# Patient Record
Sex: Female | Born: 1980 | Race: Black or African American | Hispanic: No | Marital: Single | Smoking: Former smoker
Health system: Southern US, Community
[De-identification: ages and names within clinical notes are randomized; demographics above are authoritative.]

## PROBLEM LIST (undated history)

## (undated) DIAGNOSIS — J45909 Unspecified asthma, uncomplicated: Secondary | ICD-10-CM

## (undated) DIAGNOSIS — IMO0002 Reserved for concepts with insufficient information to code with codable children: Secondary | ICD-10-CM

## (undated) DIAGNOSIS — R87619 Unspecified abnormal cytological findings in specimens from cervix uteri: Secondary | ICD-10-CM

## (undated) HISTORY — DX: Reserved for concepts with insufficient information to code with codable children: IMO0002

## (undated) HISTORY — DX: Unspecified abnormal cytological findings in specimens from cervix uteri: R87.619

---

## 2012-04-20 ENCOUNTER — Emergency Department (HOSPITAL_COMMUNITY)
Admission: EM | Admit: 2012-04-20 | Discharge: 2012-04-20 | Disposition: A | Discharge status: Home / Self Care | Payer: Self-pay | Attending: Emergency Medicine | Admitting: Emergency Medicine

## 2012-04-20 ENCOUNTER — Encounter (HOSPITAL_COMMUNITY): Payer: Self-pay | Admitting: *Deleted

## 2012-04-20 ENCOUNTER — Emergency Department (HOSPITAL_COMMUNITY): Payer: Self-pay

## 2012-04-20 DIAGNOSIS — R10811 Right upper quadrant abdominal tenderness: Secondary | ICD-10-CM | POA: Insufficient documentation

## 2012-04-20 DIAGNOSIS — N390 Urinary tract infection, site not specified: Secondary | ICD-10-CM | POA: Insufficient documentation

## 2012-04-20 DIAGNOSIS — J45909 Unspecified asthma, uncomplicated: Secondary | ICD-10-CM | POA: Insufficient documentation

## 2012-04-20 DIAGNOSIS — R1110 Vomiting, unspecified: Secondary | ICD-10-CM | POA: Insufficient documentation

## 2012-04-20 DIAGNOSIS — F17200 Nicotine dependence, unspecified, uncomplicated: Secondary | ICD-10-CM | POA: Insufficient documentation

## 2012-04-20 DIAGNOSIS — R109 Unspecified abdominal pain: Secondary | ICD-10-CM | POA: Insufficient documentation

## 2012-04-20 HISTORY — DX: Unspecified asthma, uncomplicated: J45.909

## 2012-04-20 LAB — URINE MICROSCOPIC-ADD ON

## 2012-04-20 LAB — COMPREHENSIVE METABOLIC PANEL
ALT: 12 U/L (ref 0–35)
AST: 25 U/L (ref 0–37)
Albumin: 2.9 g/dL — ABNORMAL LOW (ref 3.5–5.2)
CO2: 21 mEq/L (ref 19–32)
Chloride: 102 mEq/L (ref 96–112)
Creatinine, Ser: 0.81 mg/dL (ref 0.50–1.10)
GFR calc non Af Amer: >90 mL/min (ref >90)
Sodium: 133 mEq/L — ABNORMAL LOW (ref 135–145)
Total Bilirubin: 0.3 mg/dL (ref 0.3–1.2)

## 2012-04-20 LAB — URINALYSIS, ROUTINE W REFLEX MICROSCOPIC
Bilirubin Urine: NEGATIVE
Ketones, ur: NEGATIVE mg/dL
Nitrite: NEGATIVE
Specific Gravity, Urine: 1.021 (ref 1.005–1.030)
pH: 7 (ref 5.0–8.0)

## 2012-04-20 LAB — CBC WITH DIFFERENTIAL/PLATELET
Basophils Absolute: 0 10*3/uL (ref 0.0–0.1)
Basophils Relative: 0 % (ref 0–1)
HCT: 36.7 % (ref 36.0–46.0)
Lymphocytes Relative: 23 % (ref 12–46)
MCHC: 32.4 g/dL (ref 30.0–36.0)
Monocytes Absolute: 0.5 10*3/uL (ref 0.1–1.0)
Neutro Abs: 3.4 10*3/uL (ref 1.7–7.7)
Neutrophils Relative %: 66 % (ref 43–77)
Platelets: 219 10*3/uL (ref 150–400)
RDW: 13.8 % (ref 11.5–15.5)
WBC: 5.1 10*3/uL (ref 4.0–10.5)

## 2012-04-20 MED ORDER — DEXTROSE 5 % IV SOLN
1.0000 g | Freq: Once | INTRAVENOUS | Status: AC
  Administered 2012-04-20: 1 g via INTRAVENOUS
  Filled 2012-04-20: qty 10

## 2012-04-20 MED ORDER — SODIUM CHLORIDE 0.9 % IV BOLUS (SEPSIS)
250.0000 mL | Freq: Once | INTRAVENOUS | Status: AC
  Administered 2012-04-20: 250 mL via INTRAVENOUS

## 2012-04-20 MED ORDER — MORPHINE SULFATE 4 MG/ML IJ SOLN
4.0000 mg | Freq: Once | INTRAMUSCULAR | Status: AC
  Administered 2012-04-20: 4 mg via INTRAVENOUS
  Filled 2012-04-20: qty 1

## 2012-04-20 MED ORDER — NITROFURANTOIN MONOHYD MACRO 100 MG PO CAPS: 100.0000 mg | ORAL_CAPSULE | Freq: Two times a day (BID) | ORAL | Status: AC

## 2012-04-20 MED ORDER — ONDANSETRON HCL 4 MG/2ML IJ SOLN
4.0000 mg | Freq: Once | INTRAMUSCULAR | Status: AC
  Administered 2012-04-20: 4 mg via INTRAVENOUS
  Filled 2012-04-20: qty 2

## 2012-04-20 NOTE — ED Provider Notes (Signed)
History     CSN: [id number]  Arrival date & time 04/20/12  4098   First MD Initiated Contact with Patient 04/20/12 0848      Chief Complaint  Patient presents with  . Flank Pain    (Consider location/radiation/quality/duration/timing/severity/associated sxs/prior treatment) HPI  Past Medical History  Diagnosis Date  . Asthma     History reviewed. No pertinent past surgical history.  History reviewed. No pertinent family history.  History  Substance Use Topics  . Smoking status: Current Everyday Smoker  . Smokeless tobacco: Never Used  . Alcohol Use: Yes    OB History    Grav Para Term Preterm Abortions TAB SAB Ect Mult Living                  Review of Systems  Allergies  Review of patient's allergies indicates no known allergies.  Home Medications   Current Outpatient Rx  Name Route Sig Dispense Refill  . ALBUTEROL SULFATE HFA 108 (90 BASE) MCG/ACT IN AERS Inhalation Inhale 2 puffs into the lungs every 6 (six) hours as needed. For shortness of breath or wheeze    . NITROFURANTOIN MONOHYD MACRO 100 MG PO CAPS Oral Take 1 capsule (100 mg total) by mouth 2 (two) times daily. 10 capsule 0    BP 113/76  Pulse 117  Temp 99.1 F (37.3 C) (Oral)  Resp 16  Ht 5\' 3"  (1.6 m)  Wt 135 lb (61.236 kg)  BMI 23.91 kg/m2  SpO2 98%  LMP 04/02/2012  Physical Exam  ED Course  Procedures (including critical care time)  Labs Reviewed  URINALYSIS, ROUTINE W REFLEX MICROSCOPIC - Abnormal; Notable for the following:    Color, Urine AMBER (*)  BIOCHEMICALS MAY BE AFFECTED BY COLOR   APPearance TURBID (*)     Hgb urine dipstick MODERATE (*)     Protein, ur 100 (*)     Leukocytes, UA LARGE (*)     All other components within normal limits  URINE MICROSCOPIC-ADD ON - Abnormal; Notable for the following:    Squamous Epithelial / LPF FEW (*)     Bacteria, UA FEW (*)     All other components within normal limits  CBC WITH DIFFERENTIAL - Abnormal; Notable for the  following:    Hemoglobin 11.9 (*)     All other components within normal limits  COMPREHENSIVE METABOLIC PANEL - Abnormal; Notable for the following:    Sodium 133 (*)     Total Protein 9.6 (*)     Albumin 2.9 (*)     All other components within normal limits  POCT PREGNANCY, URINE   Ct Abdomen Pelvis Wo Contrast  04/20/2012  *RADIOLOGY REPORT*  Clinical Data: Right flank and right upper quadrant pain  CT ABDOMEN AND PELVIS WITHOUT CONTRAST  Technique:  Multidetector CT imaging of the abdomen and pelvis was performed following the standard protocol without intravenous contrast.  Comparison: None.  Findings: Lung bases are clear.  No pleural or pericardial fluid. The liver has a normal appearance without contrast.  No calcified gallstones.  The spleen is normal.  The pancreas is normal.  The adrenal glands are normal.  The kidneys are normal.  No evidence of cyst, mass, stone or hydronephrosis.  The aorta and IVC are unremarkable. The appendix appears normal.  The uterus and adnexal regions are unremarkable.  No free fluid.  No spinal abnormality. Other soft tissues of the back appear unremarkable.  IMPRESSION: Negative exam.  No cause of right-sided  pain identified. Specifically, no evidence of urinary tract stone disease.  Original Report Authenticated By: Thomasenia Sales, M.D.     1. UTI (lower urinary tract infection)       MDM  Medical screening examination/treatment/procedure(s) were performed by non-physician practitioner and as supervising physician I was immediately available for consultation/collaboration.        Derwood Kaplan, MD 04/20/12 2104

## 2012-04-20 NOTE — ED Provider Notes (Signed)
History     CSN: [id number]  Arrival date & time 04/20/12  5784   First MD Initiated Contact with Patient 04/20/12 0848      Chief Complaint  Patient presents with  . Flank Pain    (Consider location/radiation/quality/duration/timing/severity/associated sxs/prior treatment) Patient is a 31 y.o. female presenting with flank pain. The history is provided by the patient. No language interpreter was used.  Flank Pain This is a new problem. The current episode started in the past 7 days. The problem occurs daily. The problem has been gradually worsening. Associated symptoms include vomiting. Pertinent negatives include no abdominal pain, chest pain, chills, fever or nausea. Nothing aggravates the symptoms. She has tried acetaminophen for the symptoms.   31 year old female coming in with right flank pain x2 days that is worsening and constant presently. Denies any urinary tract symptoms such as dysuria or frequency, fever, vaginal discharge. Denies past medical history of kidney stone. Denies blood in her urine. States that she does have a gallbladder. States the pain is not worse after eating. She is able to eat. States that she did vomit one time yesterday. States that the pain is not related to eating. Patient has taken Motrin for pain with some relief. Past medical history of asthma. Past Medical History  Diagnosis Date  . Asthma     History reviewed. No pertinent past surgical history.  History reviewed. No pertinent family history.  History  Substance Use Topics  . Smoking status: Current Everyday Smoker  . Smokeless tobacco: Never Used  . Alcohol Use: Yes    OB History    Grav Para Term Preterm Abortions TAB SAB Ect Mult Living                  Review of Systems  Constitutional: Negative.  Negative for fever and chills.  HENT: Negative.   Eyes: Negative.   Respiratory: Negative.  Negative for shortness of breath.   Cardiovascular: Negative.  Negative for chest pain.    Gastrointestinal: Positive for vomiting. Negative for nausea, abdominal pain, diarrhea, constipation, blood in stool and abdominal distention.  Genitourinary: Positive for flank pain. Negative for dysuria, urgency, frequency, hematuria, vaginal bleeding, vaginal discharge, difficulty urinating and menstrual problem.  Neurological: Negative.  Negative for dizziness.  Psychiatric/Behavioral: Negative.   All other systems reviewed and are negative.    Allergies  Review of patient's allergies indicates no known allergies.  Home Medications   Current Outpatient Rx  Name Route Sig Dispense Refill  . ALBUTEROL SULFATE HFA 108 (90 BASE) MCG/ACT IN AERS Inhalation Inhale 2 puffs into the lungs every 6 (six) hours as needed. For shortness of breath or wheeze    . NITROFURANTOIN MONOHYD MACRO 100 MG PO CAPS Oral Take 1 capsule (100 mg total) by mouth 2 (two) times daily. 10 capsule 0    BP 113/76  Pulse 117  Temp 99.1 F (37.3 C) (Oral)  Resp 16  Ht 5\' 3"  (1.6 m)  Wt 135 lb (61.236 kg)  BMI 23.91 kg/m2  SpO2 98%  LMP 04/02/2012  Physical Exam  Nursing note and vitals reviewed. Constitutional: She is oriented to person, place, and time. She appears well-developed and well-nourished.  HENT:  Head: Normocephalic and atraumatic.  Eyes: Conjunctivae and EOM are normal. Pupils are equal, round, and reactive to light.  Neck: Normal range of motion. Neck supple.  Cardiovascular: Normal rate.   Pulmonary/Chest: Effort normal.  Abdominal: Soft. She exhibits no distension. There is tenderness. There is no  guarding.       RUQ  Musculoskeletal: Normal range of motion. She exhibits no edema and no tenderness.  Neurological: She is alert and oriented to person, place, and time. She has normal reflexes.  Skin: Skin is warm and dry.  Psychiatric: She has a normal mood and affect.    ED Course  Procedures (including critical care time) Right upper quadrant pain resolved after 4 of morphine IV.  Patient received Rocephin 1 g IV in the ER for too numerous to count white blood cells in the urine.   Labs Reviewed  URINALYSIS, ROUTINE W REFLEX MICROSCOPIC - Abnormal; Notable for the following:    Color, Urine AMBER (*)  BIOCHEMICALS MAY BE AFFECTED BY COLOR   APPearance TURBID (*)     Hgb urine dipstick MODERATE (*)     Protein, ur 100 (*)     Leukocytes, UA LARGE (*)     All other components within normal limits  URINE MICROSCOPIC-ADD ON - Abnormal; Notable for the following:    Squamous Epithelial / LPF FEW (*)     Bacteria, UA FEW (*)     All other components within normal limits  CBC WITH DIFFERENTIAL - Abnormal; Notable for the following:    Hemoglobin 11.9 (*)     All other components within normal limits  COMPREHENSIVE METABOLIC PANEL - Abnormal; Notable for the following:    Sodium 133 (*)     Total Protein 9.6 (*)     Albumin 2.9 (*)     All other components within normal limits  POCT PREGNANCY, URINE   Ct Abdomen Pelvis Wo Contrast  04/20/2012  *RADIOLOGY REPORT*  Clinical Data: Right flank and right upper quadrant pain  CT ABDOMEN AND PELVIS WITHOUT CONTRAST  Technique:  Multidetector CT imaging of the abdomen and pelvis was performed following the standard protocol without intravenous contrast.  Comparison: None.  Findings: Lung bases are clear.  No pleural or pericardial fluid. The liver has a normal appearance without contrast.  No calcified gallstones.  The spleen is normal.  The pancreas is normal.  The adrenal glands are normal.  The kidneys are normal.  No evidence of cyst, mass, stone or hydronephrosis.  The aorta and IVC are unremarkable. The appendix appears normal.  The uterus and adnexal regions are unremarkable.  No free fluid.  No spinal abnormality. Other soft tissues of the back appear unremarkable.  IMPRESSION: Negative exam.  No cause of right-sided pain identified. Specifically, no evidence of urinary tract stone disease.  Original Report Authenticated By:  Thomasenia Sales, M.D.     1. UTI (lower urinary tract infection)       MDM  Treat for urinary tract infection in the ER with IV Rocephin. Right upper quadrant pain resolved. Rx for Macrobid. CT of abdomen shows no acute findings. Patient will return if worse. She will followup with out for medical clinic next week.         Remi Haggard, NP 04/20/12 1034

## 2012-04-20 NOTE — ED Notes (Signed)
Pt reports intermittent R flank pain x's 2days accompanied with nausea/vomiting after eating meals. Denies urinary symptoms. Reports taking motrin last night with relief and was able to sleep

## 2012-04-27 NOTE — ED Provider Notes (Signed)
Medical screening examination/treatment/procedure(s) were performed by non-physician practitioner and as supervising physician I was immediately available for consultation/collaboration.  Derwood Kaplan, MD 04/27/12 [phone]

## 2012-06-04 ENCOUNTER — Telehealth: Payer: Self-pay

## 2012-06-04 NOTE — Telephone Encounter (Signed)
Pt given appointment information.  She will need to see financial counselor prior to appointment.  [facility] referral  Lesha Jager Gorden Harms, RN

## 2012-06-12 ENCOUNTER — Ambulatory Visit (INDEPENDENT_AMBULATORY_CARE_PROVIDER_SITE_OTHER): Payer: Self-pay | Admitting: Internal Medicine

## 2012-06-12 ENCOUNTER — Ambulatory Visit: Payer: Self-pay

## 2012-06-12 ENCOUNTER — Ambulatory Visit (INDEPENDENT_AMBULATORY_CARE_PROVIDER_SITE_OTHER): Payer: Self-pay

## 2012-06-12 DIAGNOSIS — B20 Human immunodeficiency virus [HIV] disease: Secondary | ICD-10-CM

## 2012-06-12 DIAGNOSIS — Z23 Encounter for immunization: Secondary | ICD-10-CM

## 2012-06-12 DIAGNOSIS — J45909 Unspecified asthma, uncomplicated: Secondary | ICD-10-CM

## 2012-06-12 DIAGNOSIS — Z79899 Other long term (current) drug therapy: Secondary | ICD-10-CM

## 2012-06-12 DIAGNOSIS — Z21 Asymptomatic human immunodeficiency virus [HIV] infection status: Secondary | ICD-10-CM

## 2012-06-12 DIAGNOSIS — Z113 Encounter for screening for infections with a predominantly sexual mode of transmission: Secondary | ICD-10-CM

## 2012-06-12 DIAGNOSIS — L0292 Furuncle, unspecified: Secondary | ICD-10-CM | POA: Insufficient documentation

## 2012-06-12 LAB — CBC WITH DIFFERENTIAL/PLATELET
Basophils Relative: 0 % (ref 0–1)
Eosinophils Absolute: 0 10*3/uL (ref 0.0–0.7)
Eosinophils Relative: 1 % (ref 0–5)
Hemoglobin: 10.7 g/dL — ABNORMAL LOW (ref 12.0–15.0)
Lymphs Abs: 1.3 10*3/uL (ref 0.7–4.0)
MCH: 27.6 pg (ref 26.0–34.0)
MCHC: 31.2 g/dL (ref 30.0–36.0)
MCV: 88.6 fL (ref 78.0–100.0)
Monocytes Relative: 13 % — ABNORMAL HIGH (ref 3–12)
Platelets: 186 10*3/uL (ref 150–400)
RBC: 3.87 MIL/uL (ref 3.87–5.11)

## 2012-06-12 LAB — LIPID PANEL
HDL: 34 mg/dL — ABNORMAL LOW (ref >39)
Total CHOL/HDL Ratio: 3.9 Ratio
VLDL: 13 mg/dL (ref 0–40)

## 2012-06-12 LAB — COMPLETE METABOLIC PANEL WITH GFR
Alkaline Phosphatase: 63 U/L (ref 39–117)
CO2: 23 mEq/L (ref 19–32)
Creat: 0.8 mg/dL (ref 0.50–1.10)
GFR, Est African American: >89 mL/min
GFR, Est Non African American: >89 mL/min
Glucose, Bld: 82 mg/dL (ref 70–99)
Total Bilirubin: 0.4 mg/dL (ref 0.3–1.2)

## 2012-06-12 LAB — RPR

## 2012-06-12 LAB — HEPATITIS B SURFACE ANTIGEN: Hepatitis B Surface Ag: NEGATIVE

## 2012-06-12 LAB — HEPATITIS B SURFACE ANTIBODY,QUALITATIVE: Hep B S Ab: POSITIVE — AB

## 2012-06-12 MED ORDER — DOXYCYCLINE HYCLATE 100 MG PO TABS: 100.0000 mg | ORAL_TABLET | Freq: Two times a day (BID) | ORAL | Status: AC

## 2012-06-12 NOTE — Progress Notes (Signed)
Patient ID: Colleen Walls, female   DOB: 1981-08-01, 31 y.o.   MRN: [id number]     [facility] for Infectious Disease  Patient Active Problem List  Diagnosis  . HIV (human immunodeficiency virus infection)  . Boil  . Asthma    Patient's Medications  New Prescriptions   DOXYCYCLINE (VIBRA-TABS) 100 MG TABLET    Take 1 tablet (100 mg total) by mouth 2 (two) times daily.  Previous Medications   ALBUTEROL (PROVENTIL HFA;VENTOLIN HFA) 108 (90 BASE) MCG/ACT INHALER    Inhale 2 puffs into the lungs every 6 (six) hours as needed. For shortness of breath or wheeze  Modified Medications   No medications on file  Discontinued Medications   No medications on file    Subjective: Colleen Walls is seen briefly while she is here for her intake visit after recently testing positive for HIV infection. She had a boil on the inside of her right thigh a few months ago that resolved spontaneously. She developed another boil in her right axilla 3 days ago. It is mildly tender. She has not had any fever. She recently changed deodorants.  Objective:    General: She is in no obvious discomfort Skin: She has a small boil in her right axilla is about 15 mm in diameter. There is no overlying cellulitis or drainage.  Lab Results No results found for this basename: HIV1RNAQUANT, HIV1RNAVL, CD4TABS     Assessment: She has a small, recurrent boil in her right axilla. I will treat her with empiric doxycycline. I asked her to call if it is not getting better within the next few days.  Plan: 1. Doxycycline 100 mg twice daily for 7 days 2. She will followup here with Dr. Cheryln Manly, MD [facility] for Infectious Disease [facility] [phone] pager   [phone] cell 06/12/2012, 11:48 AM

## 2012-06-12 NOTE — Progress Notes (Signed)
Pt presented with swollen raised area right  axilla. She states it started as a small area and increased to current size in 2 days.  Mild pain associated with sharp shooting pains occasionally.  I will ask a physician to examine area.   Dr Orvan Falconer has agreed to see the patient for this problem.    Pt states she was contacted through the [facility] as a possible contact of someone infected with HIV.  Pt reports last negative test Jan. 2013 in [place].   Other than pain in right axilla no other symptoms to report.    Laurell Josephs, RN

## 2012-06-13 LAB — URINALYSIS
Bilirubin Urine: NEGATIVE
Glucose, UA: NEGATIVE mg/dL
Ketones, ur: NEGATIVE mg/dL
Leukocytes, UA: NEGATIVE
pH: 7 (ref 5.0–8.0)

## 2012-06-13 LAB — T-HELPER CELL (CD4) - (RCID CLINIC ONLY): CD4 T Cell Abs: 150 uL — ABNORMAL LOW (ref 400–2700)

## 2012-06-14 ENCOUNTER — Other Ambulatory Visit: Payer: Self-pay | Admitting: Internal Medicine

## 2012-06-14 LAB — HIV-1 RNA ULTRAQUANT REFLEX TO GENTYP+: HIV 1 RNA Quant: 75524 copies/mL — ABNORMAL HIGH (ref <20)

## 2012-06-19 LAB — HIV-1 GENOTYPR PLUS

## 2012-06-27 ENCOUNTER — Encounter: Payer: Self-pay | Admitting: Internal Medicine

## 2012-06-27 ENCOUNTER — Ambulatory Visit (INDEPENDENT_AMBULATORY_CARE_PROVIDER_SITE_OTHER): Payer: Self-pay | Admitting: Internal Medicine

## 2012-06-27 ENCOUNTER — Ambulatory Visit: Payer: Self-pay

## 2012-06-27 VITALS — BP 102/54 | HR 90 | Temp 98.6°F | Ht 63.0 in | Wt 130.0 lb

## 2012-06-27 DIAGNOSIS — Z21 Asymptomatic human immunodeficiency virus [HIV] infection status: Secondary | ICD-10-CM

## 2012-06-27 DIAGNOSIS — Z23 Encounter for immunization: Secondary | ICD-10-CM

## 2012-06-27 DIAGNOSIS — B20 Human immunodeficiency virus [HIV] disease: Secondary | ICD-10-CM

## 2012-06-27 MED ORDER — ELVITEG-COBIC-EMTRICIT-TENOFDF 150-150-200-300 MG PO TABS: 1.0000 | ORAL_TABLET | Freq: Every day | ORAL | Status: DC

## 2012-06-27 MED ORDER — SULFAMETHOXAZOLE-TMP DS 800-160 MG PO TABS: 1.0000 | ORAL_TABLET | Freq: Every day | ORAL | Status: DC

## 2012-06-27 NOTE — Progress Notes (Signed)
HIV CLINIC NOTE  RFV: initial visit as follow up to intake Subjective:    Patient ID: Colleen Walls, female    DOB: 07/12/1981, 31 y.o.   MRN: [id number]  HPI newly hiv diagnosed in July 2013, but previously tested negative in Jan 2013. CD 4 count of 110 and VL 115,000. Genotype without mutations. ART naive. She had unprotected sex with ex-boyfriend, previously together for 4 yrs. She states that she last had unprotected sex in October 2012. She was asked by health department to get tested due to being exposed to someone with HIV.   ROS: No recent flu like illness, rash, cough, sore throat, thrush, diarrhea, headache or weight loss. She was in clinic in early sept for MRSA boil treated with doxycycline, which has now improved.  All; NKMA  Current Outpatient Prescriptions on File Prior to Visit  Medication Sig Dispense Refill  . albuterol (PROVENTIL HFA;VENTOLIN HFA) 108 (90 BASE) MCG/ACT inhaler Inhale 2 puffs into the lungs every 6 (six) hours as needed. For shortness of breath or wheeze       Active Ambulatory Problems    Diagnosis Date Noted  . HIV (human immunodeficiency virus infection) 06/12/2012  . Boil 06/12/2012  . Asthma 06/12/2012   Resolved Ambulatory Problems    Diagnosis Date Noted  . No Resolved Ambulatory Problems   No Additional Past Medical History   History  Substance Use Topics  . Smoking status: Current Every Day Smoker -- 0.3 packs/day for 5 years    Types: Cigarettes  . Smokeless tobacco: Never Used  . Alcohol Use: Yes     occasional  family history is not on file.  Review of Systems  Constitutional: Negative for fever, chills, diaphoresis, activity change, appetite change, fatigue and unexpected weight change.  HENT: Negative for congestion, sore throat, rhinorrhea, sneezing, trouble swallowing and sinus pressure.  Eyes: Negative for photophobia and visual disturbance.  Respiratory: Negative for cough, chest tightness, shortness of breath, wheezing and  stridor.  Cardiovascular: Negative for chest pain, palpitations and leg swelling.  Gastrointestinal: Negative for nausea, vomiting, abdominal pain, diarrhea, constipation, blood in stool, abdominal distention and anal bleeding.  Genitourinary: Negative for dysuria, hematuria, flank pain and difficulty urinating.  Musculoskeletal: Negative for myalgias, back pain, joint swelling, arthralgias and gait problem.  Skin: Negative for color change, pallor, rash and wound.  Neurological: Negative for dizziness, tremors, weakness and light-headedness.  Hematological: Negative for adenopathy. Does not bruise/bleed easily.  Psychiatric/Behavioral: Negative for behavioral problems, confusion, sleep disturbance, dysphoric mood, decreased concentration and agitation.       Objective:   Physical Exam BP 102/54  Pulse 90  Temp 98.6 F (37 C) (Oral)  Ht 5\' 3"  (1.6 m)  Wt 130 lb (58.968 kg)  BMI 23.03 kg/m2  LMP 06/07/2012  General Appearance:    Alert, cooperative, no distress, appears stated age  Head:    Normocephalic, without obvious abnormality, atraumatic  Eyes:    PERRL, conjunctiva/corneas clear, EOM's intact,   Ears:    Normal TM's and external ear canals, both ears  Nose:   Nares normal, septum midline, mucosa normal, no drainage    or sinus tenderness  Throat:   Lips, mucosa, and tongue normal; teeth and gums normal  Neck:   Supple, symmetrical, trachea midline, no adenopathy;      Back:     Symmetric, no curvature, ROM normal, no CVA tenderness  Lungs:     Clear to auscultation bilaterally, respirations unlabored  Chest Wall:  No tenderness or deformity   Heart:    Regular rate and rhythm, S1 and S2 normal, no murmur, rub   or gallop     Abdomen:     Soft, non-tender, bowel sounds active all four quadrants,    no masses, no organomegaly        Extremities:   Extremities normal, atraumatic, no cyanosis or edema  Pulses:   2+ and symmetric all extremities  Skin:   Skin color,  texture, turgor normal, no rashes or lesions  Lymph nodes:   + left axillary nodes palpable, nontender, mobile  Neurologic:   CNII-XII intact, normal strength, sensation and reflexes    throughout       Assessment & Plan:  HIV = have discussed various single tablet regimens, and plan on starting stribild one a day. adap approval anticipated in 2 wks. Unfortunately new clinical trial for women has not opened enrollment as of yet. Will need to start tx ASAP since CD 4 count < 200. I suspect that her HIV test in Jan was inaccurate since she states that she had last unprotected sex in Oct. Although, would not expect her to be this low.  oi proph = gave rx for bactrim ds daily. Instructed to call if rash  Health maintenance = received flu on this visit  Asthma = does not need albuterol referral at this time.  Social support = hasn't disclosed her status to anyone, tearful; offered THP services.  rtc in 6-7 wks

## 2012-07-13 ENCOUNTER — Other Ambulatory Visit: Payer: Self-pay | Admitting: *Deleted

## 2012-07-13 DIAGNOSIS — B20 Human immunodeficiency virus [HIV] disease: Secondary | ICD-10-CM

## 2012-07-13 MED ORDER — ELVITEG-COBIC-EMTRICIT-TENOFDF 150-150-200-300 MG PO TABS: 1.0000 | ORAL_TABLET | Freq: Every day | ORAL | Status: DC

## 2012-07-13 MED ORDER — SULFAMETHOXAZOLE-TMP DS 800-160 MG PO TABS: 1.0000 | ORAL_TABLET | Freq: Every day | ORAL | Status: DC

## 2012-08-16 ENCOUNTER — Encounter: Payer: Self-pay | Admitting: Internal Medicine

## 2012-08-16 ENCOUNTER — Ambulatory Visit (INDEPENDENT_AMBULATORY_CARE_PROVIDER_SITE_OTHER): Payer: Self-pay | Admitting: Internal Medicine

## 2012-08-16 VITALS — BP 115/78 | HR 90 | Temp 98.2°F | Wt 130.0 lb

## 2012-08-16 DIAGNOSIS — Z21 Asymptomatic human immunodeficiency virus [HIV] infection status: Secondary | ICD-10-CM

## 2012-08-16 DIAGNOSIS — B20 Human immunodeficiency virus [HIV] disease: Secondary | ICD-10-CM

## 2012-08-16 NOTE — Progress Notes (Signed)
HIV CLINIC NOTE  RFV: follow up after taking meds for 3 wks Subjective:    Patient ID: Colleen Walls, female    DOB: 28-Mar-1981, 31 y.o.   MRN: [id number]  HPI 31yo F, with HIV, on stribild. Takes meds in aft 1pm. By alarm. Marks calendar to help with keeping her meds. Has not missed a dose in the last 3 wks since starting meds. Overall doing well. Has good energy, good outlook. Everyone is healthy at home.   Social hx -> she cares for her 3 kids: 14,13, 11.  Current Outpatient Prescriptions on File Prior to Visit  Medication Sig Dispense Refill  . albuterol (PROVENTIL HFA;VENTOLIN HFA) 108 (90 BASE) MCG/ACT inhaler Inhale 2 puffs into the lungs every 6 (six) hours as needed. For shortness of breath or wheeze      . elvitegravir-cobicistat-emtricitabine-tenofovir (STRIBILD) 150-150-200-300 MG TABS Take 1 tablet by mouth daily with breakfast.  30 tablet  11  . sulfamethoxazole-trimethoprim (BACTRIM DS) 800-160 MG per tablet Take 1 tablet by mouth daily.  30 tablet  11   Active Ambulatory Problems    Diagnosis Date Noted  . HIV (human immunodeficiency virus infection) 06/12/2012  . Boil 06/12/2012  . Asthma 06/12/2012   Resolved Ambulatory Problems    Diagnosis Date Noted  . No Resolved Ambulatory Problems   No Additional Past Medical History     Review of Systems 10 point ROS negative    Objective:   Physical Exam BP 115/78  Pulse 90  Temp 98.2 F (36.8 C) (Oral)  Wt 130 lb (58.968 kg)  LMP 08/06/2012  Physical Exam  Constitutional: He is oriented to person, place, and time.  appears well-developed and well-nourished. No distress.  HENT:  Mouth/Throat: Oropharynx is clear and moist. No oropharyngeal exudate.  Cardiovascular: Normal rate, regular rhythm and normal heart sounds. Exam reveals no gallop and no friction rub.  No murmur heard.  Pulmonary/Chest: Effort normal and breath sounds normal. No respiratory distress. He has no wheezes.  Lymphadenopathy:  no cervical  adenopathy.  Skin: Skin is warm and dry. No rash noted. No erythema.          Assessment & Plan:  hiv = adherence is excellent, lets check labs in 2 wks and have folllow up  oi proph = continue bactrim DS daily  Health prevention = denise will set up pap smear.  rtc in December

## 2012-09-05 ENCOUNTER — Other Ambulatory Visit (INDEPENDENT_AMBULATORY_CARE_PROVIDER_SITE_OTHER): Payer: Self-pay

## 2012-09-05 DIAGNOSIS — B20 Human immunodeficiency virus [HIV] disease: Secondary | ICD-10-CM

## 2012-09-05 LAB — CBC WITH DIFFERENTIAL/PLATELET
Basophils Relative: 0 % (ref 0–1)
Eosinophils Absolute: 0.1 10*3/uL (ref 0.0–0.7)
Lymphs Abs: 1.1 10*3/uL (ref 0.7–4.0)
MCH: 28.8 pg (ref 26.0–34.0)
MCHC: 33.3 g/dL (ref 30.0–36.0)
Neutrophils Relative %: 51 % (ref 43–77)
Platelets: 303 10*3/uL (ref 150–400)
RBC: 4.31 MIL/uL (ref 3.87–5.11)

## 2012-09-05 LAB — COMPREHENSIVE METABOLIC PANEL
ALT: 19 U/L (ref 0–35)
Alkaline Phosphatase: 72 U/L (ref 39–117)
Sodium: 136 mEq/L (ref 135–145)
Total Bilirubin: 0.3 mg/dL (ref 0.3–1.2)
Total Protein: 8.3 g/dL (ref 6.0–8.3)

## 2012-09-07 LAB — HIV-1 RNA QUANT-NO REFLEX-BLD
HIV 1 RNA Quant: <20 copies/mL (ref <20)
HIV-1 RNA Quant, Log: <1.3 {Log} (ref <1.30)

## 2012-09-17 ENCOUNTER — Other Ambulatory Visit (HOSPITAL_COMMUNITY)
Admission: RE | Admit: 2012-09-17 | Discharge: 2012-09-17 | Disposition: A | Discharge status: Home / Self Care | Payer: Self-pay | Source: Ambulatory Visit | Attending: Infectious Diseases | Admitting: Infectious Diseases

## 2012-09-17 ENCOUNTER — Ambulatory Visit (INDEPENDENT_AMBULATORY_CARE_PROVIDER_SITE_OTHER): Payer: Self-pay | Admitting: *Deleted

## 2012-09-17 DIAGNOSIS — Z1151 Encounter for screening for human papillomavirus (HPV): Secondary | ICD-10-CM | POA: Insufficient documentation

## 2012-09-17 DIAGNOSIS — N898 Other specified noninflammatory disorders of vagina: Secondary | ICD-10-CM

## 2012-09-17 DIAGNOSIS — A599 Trichomoniasis, unspecified: Secondary | ICD-10-CM

## 2012-09-17 DIAGNOSIS — Z124 Encounter for screening for malignant neoplasm of cervix: Secondary | ICD-10-CM

## 2012-09-17 DIAGNOSIS — IMO0002 Reserved for concepts with insufficient information to code with codable children: Secondary | ICD-10-CM | POA: Insufficient documentation

## 2012-09-17 DIAGNOSIS — Z01419 Encounter for gynecological examination (general) (routine) without abnormal findings: Secondary | ICD-10-CM | POA: Insufficient documentation

## 2012-09-17 DIAGNOSIS — R87810 Cervical high risk human papillomavirus (HPV) DNA test positive: Secondary | ICD-10-CM | POA: Insufficient documentation

## 2012-09-17 DIAGNOSIS — R6889 Other general symptoms and signs: Secondary | ICD-10-CM

## 2012-09-17 NOTE — Patient Instructions (Addendum)
Your results will be ready in about a week.  You can look them up on MyChart.  Have a safe and happy holiday.  Angelique Blonder, Charity fundraiser.

## 2012-09-17 NOTE — Progress Notes (Addendum)
  Subjective:     Colleen Walls is a 31 y.o. woman who comes in today for a  pap smear only. Previous abnormal Pap smears: no. Contraception: condoms  Objective:    There were no vitals taken for this visit.  Pt has severe cramps prior to menses.  Has tried ibuprofen, Tylenol and Aleve without relief.  Has had 4 pregnacies and has 3 living children, 76, 22, and 14 years olds.  LMP 09/03/12.  Pt has white, cheesy-like vaginal discharge.  Pelvic Exam:  Pap smear obtained.    Assessment:    Screening pap smear.   Plan:    Follow up in one year, or as indicated by Pap results. Pt given educational materials re: BSE, HIV and heart disease, PAP smears, self-esteem, and applying for [employer]. Pt accepted condoms.  10/01/12 - Phone call to pt to let her know about treatment for Trichimonas which was sent to [name], [name], [name] to be sent out to the pt.  Also, advised pt of abnormal PAP smear results and referral to [facility] for follow-up.  RN will call the pt with the WOC appt.

## 2012-09-18 ENCOUNTER — Encounter: Payer: Self-pay | Admitting: Internal Medicine

## 2012-09-18 ENCOUNTER — Ambulatory Visit (INDEPENDENT_AMBULATORY_CARE_PROVIDER_SITE_OTHER): Payer: Self-pay | Admitting: Internal Medicine

## 2012-09-18 VITALS — BP 127/70 | HR 99 | Temp 98.0°F | Ht 63.0 in | Wt 130.0 lb

## 2012-09-18 DIAGNOSIS — Z21 Asymptomatic human immunodeficiency virus [HIV] infection status: Secondary | ICD-10-CM

## 2012-09-18 DIAGNOSIS — Z23 Encounter for immunization: Secondary | ICD-10-CM

## 2012-09-18 NOTE — Progress Notes (Signed)
HIV CLINIC VISIT  RFV: routine visit Subjective:    Patient ID: Colleen Walls, female    DOB: 09/05/1981, 31 y.o.   MRN: [id number]  HPI 31yo F with HIV, CD 4 count of 130 (11%)/ VL <20, on stribild for the last 2 months. Excellent adherence. Also taking bactrim for OI proph. Takes her meds together in 8-9 am.  Had a great time over thanksgiving. Had family from [place], [place], and [place] come to visit. Now has a sister relocating to [place] [facility]. Living with her right now.  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  Current Outpatient Prescriptions on File Prior to Visit  Medication Sig Dispense Refill  . albuterol (PROVENTIL HFA;VENTOLIN HFA) 108 (90 BASE) MCG/ACT inhaler Inhale 2 puffs into the lungs every 6 (six) hours as needed. For shortness of breath or wheeze      . elvitegravir-cobicistat-emtricitabine-tenofovir (STRIBILD) 150-150-200-300 MG TABS Take 1 tablet by mouth daily with breakfast.  30 tablet  11  . sulfamethoxazole-trimethoprim (BACTRIM DS) 800-160 MG per tablet Take 1 tablet by mouth daily.  30 tablet  11   History   Social History  . Marital Status: Single    Spouse Name: N/A    Number of Children: N/A  . Years of Education: N/A   Occupational History  . Not on file.   Social History Main Topics  . Smoking status: Current Every Day Smoker -- 0.3 packs/day for 5 years    Types: Cigarettes  . Smokeless tobacco: Never Used  . Alcohol Use: Yes     Comment: occasional  . Drug Use: No  . Sexually Active: Yes -- Female partner(s)    Birth Control/ Protection: Condom     Comment: pt. declined condoms   Other Topics Concern  . Not on file   Social History Narrative  . No narrative on file    Review of Systems 10 point ROS. In no acute distress    Objective:   Physical Exam BP 127/70  Pulse 99  Temp 98 F (36.7 C) (Oral)  Ht 5\' 3"  (1.6 m)  Wt 130 lb (58.968 kg)  BMI 23.03 kg/m2  LMP  09/04/2012 Physical Exam  Constitutional:  oriented to person, place, and time. appears well-developed and well-nourished. No distress.  HENT:  Mouth/Throat: Oropharynx is clear and moist. No oropharyngeal exudate.  Cardiovascular: Normal rate, regular rhythm and normal heart sounds. Exam reveals no gallop and no friction rub.  No murmur heard.  Pulmonary/Chest: Effort normal and breath sounds normal. No respiratory distress.  no wheezes.  Abdominal: Soft. Bowel sounds are normal.  no distension. There is no tenderness.  Lymphadenopathy: no cervical adenopathy.  Skin: Skin is warm and dry. No rash noted. No erythema.       Assessment & Plan:  HIV = doing excellent with stribild. Will continue with taking daily. Will recheck CD 4 count and VL in 3 months  Oi proph = will continue with bactrim until CD 4 count > 200.

## 2012-10-01 ENCOUNTER — Telehealth: Payer: Self-pay | Admitting: *Deleted

## 2012-10-01 ENCOUNTER — Encounter: Payer: Self-pay | Admitting: Obstetrics & Gynecology

## 2012-10-01 MED ORDER — METRONIDAZOLE 500 MG PO TABS: 500.0000 mg | ORAL_TABLET | Freq: Two times a day (BID) | ORAL | Status: DC

## 2012-10-01 NOTE — Addendum Note (Signed)
Addended by: Jennet Maduro D on: 10/01/2012 12:28 PM   Modules accepted: Orders

## 2012-10-01 NOTE — Addendum Note (Signed)
Addended by: Jennet Maduro D on: 10/01/2012 10:03 AM   Modules accepted: Orders

## 2012-10-01 NOTE — Telephone Encounter (Signed)
Appointment scheduled for pt.  WOC will notify pt of the appt.

## 2012-10-04 ENCOUNTER — Other Ambulatory Visit: Payer: Self-pay | Admitting: Internal Medicine

## 2012-10-12 ENCOUNTER — Encounter: Payer: Self-pay | Admitting: Internal Medicine

## 2012-10-15 NOTE — Telephone Encounter (Signed)
Phone call to [facility] to find out about the delivery.

## 2012-10-18 ENCOUNTER — Other Ambulatory Visit (HOSPITAL_COMMUNITY)
Admission: RE | Admit: 2012-10-18 | Discharge: 2012-10-18 | Disposition: A | Discharge status: Home / Self Care | Payer: Medicaid Other | Source: Ambulatory Visit | Attending: Obstetrics & Gynecology | Admitting: Obstetrics & Gynecology

## 2012-10-18 ENCOUNTER — Encounter: Payer: Self-pay | Admitting: Obstetrics & Gynecology

## 2012-10-18 ENCOUNTER — Ambulatory Visit (INDEPENDENT_AMBULATORY_CARE_PROVIDER_SITE_OTHER): Payer: Medicaid Other | Admitting: Obstetrics & Gynecology

## 2012-10-18 VITALS — BP 111/68 | HR 80 | Temp 97.3°F | Ht 63.0 in | Wt 129.0 lb

## 2012-10-18 DIAGNOSIS — R87619 Unspecified abnormal cytological findings in specimens from cervix uteri: Secondary | ICD-10-CM | POA: Insufficient documentation

## 2012-10-18 DIAGNOSIS — R87610 Atypical squamous cells of undetermined significance on cytologic smear of cervix (ASC-US): Secondary | ICD-10-CM

## 2012-10-18 NOTE — Progress Notes (Signed)
Patient ID: Colleen Walls, female   DOB: 1981/03/28, 32 y.o.   MRN: [id number]  Chief Complaint  Patient presents with  . Colposcopy    HPI [name] is a 32 y.o. female.  Referred for evaluation of abnormal pap 09/17/12, ASCUS pos HR HPV. HIV positive, followed by ID [id number] Patient's last menstrual period was 10/04/2012. Uses condoms for [facility] HPI  Indications: Pap smear on December 2013 showed: ASCUS with POSITIVE high risk HPV.    Past Medical History  Diagnosis Date  . Asthma   . Abnormal Pap smear   HIV positive  History reviewed. No pertinent past surgical history.  History reviewed. No pertinent family history.  Social History History  Substance Use Topics  . Smoking status: Current Every Day Smoker -- 0.3 packs/day for 5 years    Types: Cigarettes  . Smokeless tobacco: Never Used     Comment: is insured  . Alcohol Use: Yes     Comment: occasional    No Known Allergies  Current Outpatient Prescriptions  Medication Sig Dispense Refill  . albuterol (PROVENTIL HFA;VENTOLIN HFA) 108 (90 BASE) MCG/ACT inhaler Inhale 2 puffs into the lungs every 6 (six) hours as needed. For shortness of breath or wheeze      . elvitegravir-cobicistat-emtricitabine-tenofovir (STRIBILD) 150-150-200-300 MG TABS Take 1 tablet by mouth daily with breakfast.  30 tablet  11  . sulfamethoxazole-trimethoprim (BACTRIM DS) 800-160 MG per tablet Take 1 tablet by mouth daily.  30 tablet  11  . metroNIDAZOLE (FLAGYL) 500 MG tablet Take 1 tablet (500 mg total) by mouth 2 (two) times daily.  14 tablet  0    Review of Systems Review of Systems  Constitutional: Negative for fever.  Respiratory: Negative for cough.   Genitourinary: Negative for vaginal discharge, menstrual problem and pelvic pain.    Blood pressure 111/68, pulse 80, temperature 97.3 F (36.3 C), height 5\' 3"  (1.6 m), weight 129 lb (58.514 kg), last menstrual period 10/04/2012.  Physical Exam Physical Exam  Constitutional: She  appears well-developed and well-nourished.  Psychiatric: She has a normal mood and affect. Her behavior is normal.    Data Reviewed Pap result  Assessment    Procedure Details  The risks and benefits of the procedure and Written informed consent obtained.  Speculum placed in vagina and excellent visualization of cervix achieved, cervix swabbed x 3 with acetic acid solution. Cervix no gross lesion. SCJ not seen. Mild AWE 01-999, Bx at 500. Monsels applied  Specimens: ECC and biopsy  Complications: none.     Plan    Specimens labelled and sent to Pathology. Return to discuss Pathology results in 2 weeks.      ARNOLD,JAMES 10/18/2012, 2:49 PM

## 2012-10-18 NOTE — Patient Instructions (Signed)
Colposcopy Colposcopy is a procedure that uses a special lighted microscope (colposcope). It examines your cervix and vagina, or the area around the outside of the vagina, for signs of disease or abnormalities in the cells. You may be sent to a specialist (gynecologist) to do the colposcopy. A biopsy (tissue sample) may be collected during a colposcopy, if the caregiver finds any unusual cells. The biopsy is sent to the lab for further testing, and the results are reported back to your caregiver. A WOMAN MAY NEED THIS PROCEDURE IF:  She has had an abnormal pap smear (taking cells from the cervix for testing).  She has a sore on her cervix, and a Pap test was normal.  The Pap test suggests human papilloma virus (HPV). This virus can cause genital warts and is linked to the development of cervical cancer.  She has genital warts on the cervix, or in or around the outside of the vagina.  Her mother took the drug DES while pregnant.  She has painful intercourse.  She has vaginal bleeding, especially after sexual intercourse.  There is a need to evaluate the results of previous treatment. BEFORE THE PROCEDURE   Colposcopy is done when you are not having a menstrual period.  For 24 hours before the colposcopy, do not:  Douche.  Use tampons.  Use medicines, creams, or suppositories in the vagina.  Have sexual intercourse. PROCEDURE   A colposcopy is done while a woman is lying on her back with her feet in foot rests (stirrups).  A speculum is placed inside the vagina to keep it open and to allow the caregiver to see the cervix. This is the same instrument used to do a pap smear.  The colposcope is placed outside the vagina. It is used to magnify and examine the cervix, vagina, and the area around the outside of the vagina.  A small amount of liquid solution is placed on the area that is to be viewed. This solution is placed on with a cotton applicator. This solution makes it easier to  see the abnormal cells.  Your caregiver will suck out mucus and cells from the canal of the cervix.  Small pieces of tissue for biopsy may be taken at the same time. You may feel mild pain or discomfort when this is done.  Your caregiver will record the location of the abnormal areas and send the tissue samples to a lab for analysis.  If your caregiver biopsies the vagina or outside of the vagina, a local anesthetic (novocaine) is usually given. AFTER THE PROCEDURE   You may have some cramping that often goes away in a few minutes. You may have some soreness for a couple of days.  You may take over-the-counter pain medicine as advised by your caregiver. Do not take aspirin because it can cause bleeding.  Lie down for a few minutes if you feel lightheaded.  You may have some bleeding or dark discharge that should stop in a few days.  You may need to wear a sanitary pad for a few days. HOME CARE INSTRUCTIONS   Avoid sex, douching, and using tampons for a week or as directed.  Only take medicine as directed by your caregiver.  Continue to take birth control pills, if you are on them.  Not all test results are available during your visit. If your test results are not back during the visit, make an appointment with your caregiver to find out the results. Do not assume everything is  normal if you have not heard from your caregiver or the medical facility. It is important for you to follow up on all of your test results.  Follow your caregiver's advice regarding medicines, activity, follow-up visits, and follow-up Pap tests. SEEK MEDICAL CARE IF:   You develop a rash.  You have problems with your medicine. SEEK IMMEDIATE MEDICAL CARE IF:  You are bleeding heavily or are passing blood clots.  You develop a fever over 102 F (38.9 C), with or without chills.  You have abnormal vaginal discharge.  You are having cramps that do not go away after taking your pain medicine.  You  feel lightheaded, dizzy, or faint.  You develop stomach pain. Document Released: 12/17/2002 Document Revised: 12/19/2011 Document Reviewed: 07/30/2009 [facility] Patient Information 2013 [place], [place]. Colposcopy Care After Colposcopy is a procedure in which a special tool is used to magnify the surface of the cervix. A tissue sample (biopsy) may also be taken. This sample will be looked at for cervical cancer or other problems. After the test:  You may have some cramping.  Lie down for a few minutes if you feel lightheaded.   You may have some bleeding which should stop in a few days. HOME CARE  Do not have sex or use tampons for 2 to 3 days or as told.  Only take medicine as told by your doctor.  Continue to take your birth control pills as usual. Finding out the results of your test Ask when your test results will be ready. Make sure you get your test results. GET HELP RIGHT AWAY IF:  You are bleeding a lot or are passing blood clots.  You develop a fever of 102 F (38.9 C) or higher.  You have abnormal vaginal discharge.  You have cramps that do not go away with medicine.  You feel lightheaded, dizzy, or pass out (faint). MAKE SURE YOU:   Understand these instructions.  Will watch your condition.  Will get help right away if you are not doing well or get worse. Document Released: 03/14/2008 Document Revised: 12/19/2011 Document Reviewed: 03/14/2008 [facility] Patient Information 2013 [place], [place].

## 2012-10-18 NOTE — Addendum Note (Signed)
Addended by: Sherre Lain A on: 10/18/2012 03:35 PM   Modules accepted: Orders

## 2012-10-19 ENCOUNTER — Encounter: Payer: Self-pay | Admitting: *Deleted

## 2012-11-01 ENCOUNTER — Ambulatory Visit (INDEPENDENT_AMBULATORY_CARE_PROVIDER_SITE_OTHER): Payer: Medicaid Other | Admitting: Medical

## 2012-11-01 ENCOUNTER — Encounter: Payer: Self-pay | Admitting: Obstetrics & Gynecology

## 2012-11-01 VITALS — BP 106/67 | HR 84 | Temp 97.5°F | Ht 63.0 in | Wt 129.8 lb

## 2012-11-01 DIAGNOSIS — R6889 Other general symptoms and signs: Secondary | ICD-10-CM

## 2012-11-01 DIAGNOSIS — IMO0002 Reserved for concepts with insufficient information to code with codable children: Secondary | ICD-10-CM

## 2012-11-01 NOTE — Patient Instructions (Signed)
HPV Test The HPV (human papillomavirus) test is used to screen for high-risk types with HPV infection. HPV is a group of about 100 related viruses, of which 40 types are genital viruses. Most HPV viruses cause infections that usually resolve without treatment within 2 years. Some HPV infections can cause skin and genital warts (condylomata). HPV types 16, 18, 31 and 45 are considered high-risk types of HPV. High-risk types of HPV do not usually cause visible warts, but if untreated, may lead to cancers of the outlet of the womb (cervix) or anus. An HPV test identifies the DNA (genetic) strands of the HPV infection. Because the test identifies the DNA strands, the test is also referred to as the HPV DNA test. Although HPV is found in both males and females, the HPV test is only used to screen for cervical cancer in females. This test is recommended for females:  With an abnormal Pap test.  After treatment of an abnormal Pap test.  Aged 30 and older.  After treatment of a high-risk HPV infection. The HPV test may be done at the same time as a Pap test in females over the age of 30. Both the HPV and Pap test require a sample of cells from the cervix. PREPARATION FOR TEST  You may be asked to avoid douching, tampons, or vaginal medicines for 48 hours before the HPV test. You will be asked to urinate before the test. For the HPV test, you will need to lie on an exam table with your feet in stirrups. A spatula will be inserted into the vagina. The spatula will be used to swab the cervix for a cell and mucus sample. The sample will be further evaluated in a lab under a microscope. NORMAL FINDINGS  Normal: High-risk HPV is not found.  Ranges for normal findings may vary among different laboratories and hospitals. You should always check with your doctor after having lab work or other tests done to discuss the meaning of your test results and whether your values are considered within normal limits. MEANING  OF TEST An abnormal HPV test means that high-risk HPV is found. Your caregiver may recommend further testing. Your caregiver will go over the test results with you. He or she will and discuss the importance and meaning of your results, as well as treatment options and the need for additional tests, if necessary. OBTAINING THE RESULTS  It is your responsibility to obtain your test results. Ask the lab or department performing the test when and how you will get your results. Document Released: 10/21/2004 Document Revised: 12/19/2011 Document Reviewed: 07/06/2005 [employer] Patient Information 2013 [employer].  

## 2012-11-01 NOTE — Progress Notes (Signed)
Patient ID: Colleen Walls, female   DOB: 02/25/1981, 32 y.o.   MRN: [id number]  History:  [name] is a 32 y.o. [phone] who presents to clinic today for colpo results. The patient had an ASCUS pap with + HPV and colpo results were negative. The patient denies any questions or concerns since the procedure.    The following portions of the patient's history were reviewed and updated as appropriate: allergies, current medications, past family history, past medical history, past social history, past surgical history and problem list.  Review of Systems:  Pertinent items are noted in HPI.  Objective:  Physical Exam BP 106/67  Pulse 84  Temp 97.5 F (36.4 C) (Oral)  Ht 5\' 3"  (1.6 m)  Wt 129 lb 12.8 oz (58.877 kg)  BMI 22.99 kg/m2  LMP 10/04/2012 GENERAL: Well-developed, well-nourished female in no acute distress.  HEENT: Normocephalic, atraumatic.  LUNGS: Normal rate. HEART: Regular rate  Labs and Imaging Colposcopy results:  1. Endocervix, curettage - BENIGN ENDOCERVIX AND MUCUS. 2. 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  Assessment & Plan:  Assessment: ASCUS pap with + HPV, Normal Colposcopy +HIV  Plans: Patient will follow-up for repeat pap smear with HPV co-testing in one year Patient will call office in ~ 9 months to schedule this appointment Patient may return sooner if needed or conditions change  Freddi Starr, PA-C 11/01/2012 5:14 PM

## 2012-12-17 ENCOUNTER — Other Ambulatory Visit: Payer: Self-pay | Admitting: Infectious Diseases

## 2012-12-17 ENCOUNTER — Other Ambulatory Visit (INDEPENDENT_AMBULATORY_CARE_PROVIDER_SITE_OTHER): Payer: Self-pay

## 2012-12-17 DIAGNOSIS — B20 Human immunodeficiency virus [HIV] disease: Secondary | ICD-10-CM

## 2012-12-17 LAB — COMPREHENSIVE METABOLIC PANEL
AST: 24 U/L (ref 0–37)
Albumin: 3.8 g/dL (ref 3.5–5.2)
Alkaline Phosphatase: 85 U/L (ref 39–117)
Calcium: 9.4 mg/dL (ref 8.4–10.5)
Chloride: 105 mEq/L (ref 96–112)
Glucose, Bld: 75 mg/dL (ref 70–99)
Potassium: 4.4 mEq/L (ref 3.5–5.3)
Sodium: 135 mEq/L (ref 135–145)
Total Protein: 7.9 g/dL (ref 6.0–8.3)

## 2012-12-18 LAB — CBC WITH DIFFERENTIAL/PLATELET
Basophils Relative: 0 % (ref 0–1)
HCT: 34.5 % — ABNORMAL LOW (ref 36.0–46.0)
Hemoglobin: 11.5 g/dL — ABNORMAL LOW (ref 12.0–15.0)
Lymphocytes Relative: 50 % — ABNORMAL HIGH (ref 12–46)
Lymphs Abs: 1.3 10*3/uL (ref 0.7–4.0)
MCHC: 33.3 g/dL (ref 30.0–36.0)
Monocytes Absolute: 0.4 10*3/uL (ref 0.1–1.0)
Monocytes Relative: 16 % — ABNORMAL HIGH (ref 3–12)
Neutro Abs: 0.9 10*3/uL — ABNORMAL LOW (ref 1.7–7.7)
Neutrophils Relative %: 32 % — ABNORMAL LOW (ref 43–77)
RBC: 4.08 MIL/uL (ref 3.87–5.11)
WBC: 2.7 10*3/uL — ABNORMAL LOW (ref 4.0–10.5)

## 2012-12-18 LAB — HIV-1 RNA QUANT-NO REFLEX-BLD: HIV-1 RNA Quant, Log: <1.3 {Log} (ref <1.30)

## 2012-12-31 ENCOUNTER — Ambulatory Visit (INDEPENDENT_AMBULATORY_CARE_PROVIDER_SITE_OTHER): Payer: Self-pay | Admitting: Internal Medicine

## 2012-12-31 ENCOUNTER — Telehealth: Payer: Self-pay

## 2012-12-31 ENCOUNTER — Encounter: Payer: Self-pay | Admitting: Internal Medicine

## 2012-12-31 VITALS — BP 127/74 | HR 104 | Temp 97.9°F | Wt 136.0 lb

## 2012-12-31 DIAGNOSIS — Z21 Asymptomatic human immunodeficiency virus [HIV] infection status: Secondary | ICD-10-CM

## 2012-12-31 DIAGNOSIS — B20 Human immunodeficiency virus [HIV] disease: Secondary | ICD-10-CM

## 2012-12-31 NOTE — Telephone Encounter (Signed)
Patient came in for Dr appt today - had not called to schedule adap/rw appt - was asked by Feliz Beam if could squeeze in - she told Corrie Dandy and Feliz Beam she had W2 and paystubs with her - came in for adap and told me she did not work at all,  however there is note from Dr Drue Second from today, 12/31/12 in office notes that she was working at [employer] in [place], 37 hours over 2 weeks - I don't feel comfortable completing adap w/o income documentation - tried to reach patient at numbers in system and they don't work - cannot leave a message on either phone.

## 2012-12-31 NOTE — Telephone Encounter (Signed)
Patient came in for Dr appt today - had not called to schedule adap/rw appt - was asked by Colleen Walls if could squeeze in - told Colleen Walls and Colleen Walls she had W2 and paystubs with her - came in for adap and told me she did not work, however there is note from Dr Drue Second from today, 12/31/12 in office notes that she was working at [employer] in [place], 37 hours a week - I don't feel comfortable completing adap w/o documentation - tried to reach patient at numbers in system and they don't work - cannot leave a message on either phone.

## 2012-12-31 NOTE — Progress Notes (Signed)
[facility] NOTE  RFV: routine follow up Subjective:    Patient ID: Colleen Walls, female    DOB: 1981-06-29, 32 y.o.   MRN: [id number]  HPI Colleen Walls is 32yo F with HIV, CD 4 count of 210/VL<20 (march 2014), on stribild for 5 months doing well. Also on bactrim for OI proph. Doing well with medications, no missing doses. No fever,chills, nightsweats  Went back to work, works at [employer] in [employer]. 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  Ros: Gained 8 lb since visit; reports some teeth pain lower gum line   Current Outpatient Prescriptions on File Prior to Visit  Medication Sig Dispense Refill  . albuterol (PROVENTIL HFA;VENTOLIN HFA) 108 (90 BASE) MCG/ACT inhaler Inhale 2 puffs into the lungs every 6 (six) hours as needed. For shortness of breath or wheeze      . elvitegravir-cobicistat-emtricitabine-tenofovir (STRIBILD) 150-150-200-300 MG TABS Take 1 tablet by mouth daily with breakfast.  30 tablet  11  . sulfamethoxazole-trimethoprim (BACTRIM DS) 800-160 MG per tablet Take 1 tablet by mouth daily.  30 tablet  11   No current facility-administered medications on file prior to visit.   Active Ambulatory Problems    Diagnosis Date Noted  . HIV (human immunodeficiency virus infection) 06/12/2012  . Boil 06/12/2012  . Asthma 06/12/2012  . ASCUS with positive high risk HPV 09/17/2012   Resolved Ambulatory Problems    Diagnosis Date Noted  . No Resolved Ambulatory Problems   Past Medical History  Diagnosis Date  . Abnormal Pap smear        Review of Systems     Objective:   Physical Exam BP 127/74  Pulse 104  Temp(Src) 97.9 F (36.6 C) (Oral)  Wt 136 lb (61.689 kg)  BMI 24.1 kg/m2 Physical Exam  Constitutional:  oriented to person, place, and time. appears well-developed and well-nourished. No distress.  HENT:  Mouth/Throat: Oropharynx is clear and moist. No oropharyngeal exudate. Poor dentition, gum line erosion on bottom front teeth. Some  periodontal disease Cardiovascular: Normal rate, regular rhythm and normal heart sounds. Exam reveals no gallop and no friction rub.  No murmur heard.  Pulmonary/Chest: Effort normal and breath sounds normal. No respiratory distress.  no wheezes.  Abdominal: Soft. Bowel sounds are normal. He exhibits no distension. There is no tenderness.  Lymphadenopathy: no cervical adenopathy.  Neurological:alert and oriented to person, place, and time.  Skin: Skin is warm and dry. No rash noted. No erythema.  Psychiatric:  normal mood and affect.  behavior is normal.       Assessment & Plan:  HIV = continue with stribild  Health maintenance = no need vaccine  oi proph = continue with bactrim for addn 3 month, until next visit  Teeth pain = 2 front teeth discomfort with chewing, cold. Refer dental clinic  Ascus atypical pap = next follow up should be in dec 2014.  rtc in 3 months

## 2013-01-02 ENCOUNTER — Telehealth: Payer: Self-pay

## 2013-01-02 NOTE — Telephone Encounter (Signed)
Addendum to prior phone message - could not reach patient by phone, so mailed eligibility list and asked her to call the me.

## 2013-02-18 ENCOUNTER — Encounter: Payer: Self-pay | Admitting: *Deleted

## 2013-03-21 ENCOUNTER — Other Ambulatory Visit: Payer: Self-pay

## 2013-04-04 ENCOUNTER — Ambulatory Visit (INDEPENDENT_AMBULATORY_CARE_PROVIDER_SITE_OTHER): Payer: Self-pay | Admitting: Internal Medicine

## 2013-04-04 ENCOUNTER — Encounter: Payer: Self-pay | Admitting: Internal Medicine

## 2013-04-04 VITALS — BP 115/75 | HR 87 | Temp 98.2°F | Ht 63.0 in | Wt 135.0 lb

## 2013-04-04 DIAGNOSIS — B20 Human immunodeficiency virus [HIV] disease: Secondary | ICD-10-CM

## 2013-04-04 DIAGNOSIS — R110 Nausea: Secondary | ICD-10-CM

## 2013-04-04 LAB — COMPREHENSIVE METABOLIC PANEL
ALT: 26 U/L (ref 0–35)
Albumin: 4.3 g/dL (ref 3.5–5.2)
CO2: 24 mEq/L (ref 19–32)
Calcium: 9.2 mg/dL (ref 8.4–10.5)
Chloride: 105 mEq/L (ref 96–112)
Glucose, Bld: 70 mg/dL (ref 70–99)
Potassium: 4 mEq/L (ref 3.5–5.3)
Sodium: 135 mEq/L (ref 135–145)
Total Bilirubin: 0.6 mg/dL (ref 0.3–1.2)
Total Protein: 8.3 g/dL (ref 6.0–8.3)

## 2013-04-04 LAB — CBC
Hemoglobin: 12.4 g/dL (ref 12.0–15.0)
MCH: 28.6 pg (ref 26.0–34.0)
Platelets: 340 10*3/uL (ref 150–400)
RBC: 4.34 MIL/uL (ref 3.87–5.11)
WBC: 3.1 10*3/uL — ABNORMAL LOW (ref 4.0–10.5)

## 2013-04-04 MED ORDER — PROMETHAZINE HCL 25 MG PO TABS: 25.0000 mg | ORAL_TABLET | Freq: Four times a day (QID) | ORAL | Status: DC | PRN

## 2013-04-04 NOTE — Progress Notes (Signed)
[facility]  RFV: routine Subjective:    Patient ID: Colleen Walls, female    DOB: 1981-03-29, 32 y.o.   MRN: [id number]  HPI Brendaly is a 32yo F with HIV, CD 4 count 210/VL<20, on stribild and bactrim.  Kids away at family, and maybe going to [place] for weekend. Currently At [employer], doing more classes as Estate manager/land agent. Caught a summer cold, but getting better. Has had some residual nausea. LMP 5/26.  Current Outpatient Prescriptions on File Prior to Visit  Medication Sig Dispense Refill  . albuterol (PROVENTIL HFA;VENTOLIN HFA) 108 (90 BASE) MCG/ACT inhaler Inhale 2 puffs into the lungs every 6 (six) hours as needed. For shortness of breath or wheeze      . elvitegravir-cobicistat-emtricitabine-tenofovir (STRIBILD) 150-150-200-300 MG TABS Take 1 tablet by mouth daily with breakfast.  30 tablet  11  . sulfamethoxazole-trimethoprim (BACTRIM DS) 800-160 MG per tablet Take 1 tablet by mouth daily.  30 tablet  11   No current facility-administered medications on file prior to visit.    Social hx : unchanged  Review of Systems 12 point ROS reviewed, negative, except post uri nausea.    Objective:   Physical Exam BP 115/75  Pulse 87  Temp(Src) 98.2 F (36.8 C) (Oral)  Ht 5\' 3"  (1.6 m)  Wt 135 lb (61.236 kg)  BMI 23.92 kg/m2  LMP 03/04/2013 Physical Exam  Constitutional:  oriented to person, place, and time. appears well-developed and well-nourished. No distress.  HENT:  Mouth/Throat: Oropharynx is clear and moist. No oropharyngeal exudate.  Cardiovascular: Normal rate, regular rhythm and normal heart sounds. Exam reveals no gallop and no friction rub.  No murmur heard.  Pulmonary/Chest: Effort normal and breath sounds normal. No respiratory distress.  has no wheezes.  Abdominal: Soft. Bowel sounds are normal.  exhibits no distension. There is no tenderness.  Lymphadenopathy:  no cervical adenopathy.  Neurological:  alert and oriented to person, place, and time.   Skin: Skin is warm and dry. No rash noted. No erythema.  Psychiatric:a normal mood and affect.behavior is normal.      Assessment & Plan:  HIV = continue with stribild   Nausea = will give rx for phenergan  Health maintenance = no need vaccine   oi proph = will check labs today, and likely can stop bactrim once CD 4 count returns  Teeth pain =  dental clinic waiting list  Ascus atypical pap = next follow up should be in dec 2014.

## 2013-04-05 LAB — HIV-1 RNA QUANT-NO REFLEX-BLD
HIV 1 RNA Quant: 23 copies/mL — ABNORMAL HIGH (ref <20)
HIV-1 RNA Quant, Log: 1.36 {Log} — ABNORMAL HIGH (ref <1.30)

## 2013-06-12 ENCOUNTER — Telehealth: Payer: Self-pay | Admitting: *Deleted

## 2013-06-12 ENCOUNTER — Other Ambulatory Visit: Payer: Self-pay | Admitting: Internal Medicine

## 2013-06-12 NOTE — Telephone Encounter (Signed)
Received refill requested for discontinued medication, metronidazole.  Need to speak with the pt to determine need for refill.  Unable to speak with pt due to phone numbers not working.  Will send message to Dr. Drue Second to ask about refill authorization.

## 2013-06-13 ENCOUNTER — Other Ambulatory Visit: Payer: Self-pay | Admitting: Internal Medicine

## 2013-06-13 ENCOUNTER — Ambulatory Visit: Payer: Self-pay

## 2013-06-13 DIAGNOSIS — R110 Nausea: Secondary | ICD-10-CM

## 2013-07-02 ENCOUNTER — Other Ambulatory Visit (INDEPENDENT_AMBULATORY_CARE_PROVIDER_SITE_OTHER): Payer: Self-pay

## 2013-07-02 DIAGNOSIS — B20 Human immunodeficiency virus [HIV] disease: Secondary | ICD-10-CM

## 2013-07-02 LAB — COMPLETE METABOLIC PANEL WITH GFR
AST: 23 U/L (ref 0–37)
Albumin: 3.8 g/dL (ref 3.5–5.2)
BUN: 5 mg/dL — ABNORMAL LOW (ref 6–23)
CO2: 23 mEq/L (ref 19–32)
Calcium: 8.9 mg/dL (ref 8.4–10.5)
Chloride: 108 mEq/L (ref 96–112)
Creat: 0.97 mg/dL (ref 0.50–1.10)
GFR, Est African American: 89 mL/min
Glucose, Bld: 82 mg/dL (ref 70–99)
Potassium: 4.1 mEq/L (ref 3.5–5.3)

## 2013-07-03 LAB — CBC WITH DIFFERENTIAL/PLATELET
Basophils Absolute: 0 10*3/uL (ref 0.0–0.1)
Basophils Relative: 0 % (ref 0–1)
Eosinophils Relative: 2 % (ref 0–5)
HCT: 34.7 % — ABNORMAL LOW (ref 36.0–46.0)
Hemoglobin: 11.6 g/dL — ABNORMAL LOW (ref 12.0–15.0)
Lymphocytes Relative: 51 % — ABNORMAL HIGH (ref 12–46)
MCH: 29.9 pg (ref 26.0–34.0)
MCHC: 33.4 g/dL (ref 30.0–36.0)
MCV: 89.4 fL (ref 78.0–100.0)
Monocytes Absolute: 0.3 10*3/uL (ref 0.1–1.0)
Neutrophils Relative %: 34 % — ABNORMAL LOW (ref 43–77)
RDW: 13.9 % (ref 11.5–15.5)
WBC: 2.7 10*3/uL — ABNORMAL LOW (ref 4.0–10.5)

## 2013-07-03 LAB — HIV-1 RNA QUANT-NO REFLEX-BLD: HIV 1 RNA Quant: <20 copies/mL (ref <20)

## 2013-07-03 LAB — PATHOLOGIST SMEAR REVIEW

## 2013-07-03 LAB — T-HELPER CELL (CD4) - (RCID CLINIC ONLY): CD4 T Cell Abs: 240 /uL — ABNORMAL LOW (ref 400–2700)

## 2013-07-09 ENCOUNTER — Ambulatory Visit: Payer: Self-pay

## 2013-07-16 ENCOUNTER — Ambulatory Visit: Payer: Self-pay | Admitting: Internal Medicine

## 2013-07-25 ENCOUNTER — Encounter: Payer: Self-pay | Admitting: Internal Medicine

## 2013-07-25 ENCOUNTER — Ambulatory Visit (INDEPENDENT_AMBULATORY_CARE_PROVIDER_SITE_OTHER): Payer: Self-pay | Admitting: Internal Medicine

## 2013-07-25 VITALS — BP 115/74 | HR 87 | Temp 97.4°F | Wt 142.0 lb

## 2013-07-25 DIAGNOSIS — Z21 Asymptomatic human immunodeficiency virus [HIV] infection status: Secondary | ICD-10-CM

## 2013-07-25 DIAGNOSIS — B20 Human immunodeficiency virus [HIV] disease: Secondary | ICD-10-CM

## 2013-07-25 DIAGNOSIS — Z23 Encounter for immunization: Secondary | ICD-10-CM

## 2013-07-25 NOTE — Progress Notes (Signed)
[facility] NOTE  RFV: routine  Subjective:    Patient ID: Colleen Walls, female    DOB: 1981-09-24, 32 y.o.   MRN: [id number]  HPI 32yo F with HIV, CD 4 count 240/VL< 20, on stribild and bactrim. No missing doses. Doing well. Recovering from cold. She states that she is doing well with school and managing getting her kids to work  Current Outpatient Prescriptions on File Prior to Visit  Medication Sig Dispense Refill  . albuterol (PROVENTIL HFA;VENTOLIN HFA) 108 (90 BASE) MCG/ACT inhaler Inhale 2 puffs into the lungs every 6 (six) hours as needed. For shortness of breath or wheeze      . elvitegravir-cobicistat-emtricitabine-tenofovir (STRIBILD) 150-150-200-300 MG TABS Take 1 tablet by mouth daily with breakfast.  30 tablet  11  . promethazine (PHENERGAN) 25 MG tablet TAKE 1 TABLET BY MOUTH EVERY 6 HOURS AS NEEDED FOR NAUSEA  20 tablet  0  . sulfamethoxazole-trimethoprim (BACTRIM DS) 800-160 MG per tablet Take 1 tablet by mouth daily.  30 tablet  11   No current facility-administered medications on file prior to visit.   Active Ambulatory Problems    Diagnosis Date Noted  . HIV (human immunodeficiency virus infection) 06/12/2012  . Boil 06/12/2012  . Asthma 06/12/2012  . ASCUS with positive high risk HPV 09/17/2012   Resolved Ambulatory Problems    Diagnosis Date Noted  . No Resolved Ambulatory Problems   Past Medical History  Diagnosis Date  . Abnormal Pap smear       Review of Systems 12 point ROS is negative. Doing well overall    Objective:   Physical Exam BP 115/74  Pulse 87  Temp(Src) 97.4 F (36.3 C) (Oral)  Wt 142 lb (64.411 kg)  BMI 25.16 kg/m2  LMP 07/15/2013 Physical Exam  Constitutional:  oriented to person, place, and time. appears well-developed and well-nourished. No distress.  HENT:  Mouth/Throat: Oropharynx is clear and moist. No oropharyngeal exudate.  Cardiovascular: Normal rate, regular rhythm and normal heart sounds. Exam reveals no gallop and no  friction rub.  No murmur heard.  Pulmonary/Chest: Effort normal and breath sounds normal. No respiratory distress. has no wheezes.  Abdominal: Soft. Bowel sounds are normal.  exhibits no distension. There is no tenderness.  Lymphadenopathy:  no cervical adenopathy.  Neurological:  alert and oriented to person, place, and time.  Skin: Skin is warm and dry. No rash noted. No erythema.  Psychiatric:  a normal mood and affect.behavior is normal.        Assessment & Plan:  HIv = continue on stribild. May have 2 wks lapse due to adap renewal  Ascus on pap = to have repeat pap in dec  Health maintenance = Flu vax today  rtc in 3 months

## 2013-07-29 ENCOUNTER — Encounter: Payer: Self-pay | Admitting: Internal Medicine

## 2013-07-30 ENCOUNTER — Other Ambulatory Visit: Payer: Self-pay | Admitting: *Deleted

## 2013-07-30 DIAGNOSIS — B20 Human immunodeficiency virus [HIV] disease: Secondary | ICD-10-CM

## 2013-07-30 DIAGNOSIS — R110 Nausea: Secondary | ICD-10-CM

## 2013-07-30 MED ORDER — SULFAMETHOXAZOLE-TMP DS 800-160 MG PO TABS: 1.0000 | ORAL_TABLET | Freq: Every day | ORAL | Status: DC

## 2013-07-30 MED ORDER — PROMETHAZINE HCL 25 MG PO TABS: 25.0000 mg | ORAL_TABLET | Freq: Four times a day (QID) | ORAL | Status: DC | PRN

## 2013-07-30 MED ORDER — ELVITEG-COBIC-EMTRICIT-TENOFDF 150-150-200-300 MG PO TABS: 1.0000 | ORAL_TABLET | Freq: Every day | ORAL | Status: DC

## 2013-09-10 ENCOUNTER — Other Ambulatory Visit: Payer: Self-pay | Admitting: Licensed Clinical Social Worker

## 2013-09-10 DIAGNOSIS — R110 Nausea: Secondary | ICD-10-CM

## 2013-09-10 MED ORDER — PROMETHAZINE HCL 25 MG PO TABS: 25.0000 mg | ORAL_TABLET | Freq: Four times a day (QID) | ORAL | Status: DC | PRN

## 2013-10-23 ENCOUNTER — Other Ambulatory Visit: Payer: Self-pay | Admitting: *Deleted

## 2013-10-23 DIAGNOSIS — R110 Nausea: Secondary | ICD-10-CM

## 2013-10-23 MED ORDER — PROMETHAZINE HCL 25 MG PO TABS: 25.0000 mg | ORAL_TABLET | Freq: Four times a day (QID) | ORAL | Status: DC | PRN

## 2013-10-24 ENCOUNTER — Ambulatory Visit: Payer: Self-pay

## 2013-10-24 ENCOUNTER — Ambulatory Visit (INDEPENDENT_AMBULATORY_CARE_PROVIDER_SITE_OTHER): Payer: Self-pay | Admitting: Internal Medicine

## 2013-10-24 ENCOUNTER — Encounter: Payer: Self-pay | Admitting: Internal Medicine

## 2013-10-24 VITALS — BP 126/83 | HR 85 | Temp 98.3°F | Ht 63.0 in | Wt 151.8 lb

## 2013-10-24 DIAGNOSIS — B20 Human immunodeficiency virus [HIV] disease: Secondary | ICD-10-CM

## 2013-10-24 LAB — CBC WITH DIFFERENTIAL/PLATELET
BASOS ABS: 0 10*3/uL (ref 0.0–0.1)
Basophils Relative: 0 % (ref 0–1)
EOS ABS: 0.1 10*3/uL (ref 0.0–0.7)
Eosinophils Relative: 1 % (ref 0–5)
HEMATOCRIT: 34.3 % — AB (ref 36.0–46.0)
HEMOGLOBIN: 11.6 g/dL — AB (ref 12.0–15.0)
Lymphocytes Relative: 36 % (ref 12–46)
Lymphs Abs: 1.4 10*3/uL (ref 0.7–4.0)
MCH: 29.2 pg (ref 26.0–34.0)
MCHC: 33.8 g/dL (ref 30.0–36.0)
MCV: 86.4 fL (ref 78.0–100.0)
MONO ABS: 0.4 10*3/uL (ref 0.1–1.0)
MONOS PCT: 11 % (ref 3–12)
NEUTROS ABS: 2 10*3/uL (ref 1.7–7.7)
NEUTROS PCT: 52 % (ref 43–77)
Platelets: 318 10*3/uL (ref 150–400)
RBC: 3.97 MIL/uL (ref 3.87–5.11)
RDW: 13.8 % (ref 11.5–15.5)
WBC: 3.9 10*3/uL — ABNORMAL LOW (ref 4.0–10.5)

## 2013-10-24 LAB — COMPREHENSIVE METABOLIC PANEL
ALK PHOS: 84 U/L (ref 39–117)
ALT: 15 U/L (ref 0–35)
AST: 20 U/L (ref 0–37)
Albumin: 3.8 g/dL (ref 3.5–5.2)
BILIRUBIN TOTAL: 0.3 mg/dL (ref 0.3–1.2)
BUN: 8 mg/dL (ref 6–23)
CHLORIDE: 109 meq/L (ref 96–112)
CO2: 22 mEq/L (ref 19–32)
CREATININE: 0.92 mg/dL (ref 0.50–1.10)
Calcium: 8.9 mg/dL (ref 8.4–10.5)
Glucose, Bld: 81 mg/dL (ref 70–99)
Potassium: 4.1 mEq/L (ref 3.5–5.3)
Sodium: 137 mEq/L (ref 135–145)
Total Protein: 7.2 g/dL (ref 6.0–8.3)

## 2013-10-24 NOTE — Progress Notes (Signed)
   Subjective:    Patient ID: Colleen Walls, female    DOB: [birth year], 33 y.o.   MRN: [id number]  HPI 33yo F with HIV, Cd 4 count of 240/VL<20 on stribild and bactrim for oi proph. She is doing well with adherence. Still has some nausea with stribild and takes phenergan to help. Continues to go to school doing well. Weight gain over the holidays.  Current Outpatient Prescriptions on File Prior to Visit  Medication Sig Dispense Refill  . albuterol (PROVENTIL HFA;VENTOLIN HFA) 108 (90 BASE) MCG/ACT inhaler Inhale 2 puffs into the lungs every 6 (six) hours as needed. For shortness of breath or wheeze      . elvitegravir-cobicistat-emtricitabine-tenofovir (STRIBILD) 150-150-200-300 MG TABS tablet Take 1 tablet by mouth daily with breakfast.  30 tablet  6  . promethazine (PHENERGAN) 25 MG tablet Take 1 tablet (25 mg total) by mouth every 6 (six) hours as needed for nausea.  20 tablet  3   No current facility-administered medications on file prior to visit.   Active Ambulatory Problems    Diagnosis Date Noted  . HIV (human immunodeficiency virus infection) 06/12/2012  . Boil 06/12/2012  . Asthma 06/12/2012  . ASCUS with positive high risk HPV 09/17/2012   Resolved Ambulatory Problems    Diagnosis Date Noted  . No Resolved Ambulatory Problems   Past Medical History  Diagnosis Date  . Abnormal Pap smear    Review of Systems 12 point ROs is negative except what is mentioned in hpi    Objective:   Physical Exam  BP 126/83  Pulse 85  Temp(Src) 98.3 F (36.8 C) (Oral)  Ht 5\' 3"  (1.6 m)  Wt 151 lb 12 oz (68.833 kg)  BMI 26.89 kg/m2  LMP 10/06/2013 Physical Exam  Constitutional: oriented to person, place, and time.  appears well-developed and well-nourished. No distress.  HENT:  Mouth/Throat: Oropharynx is clear and moist. No oropharyngeal exudate.  Cardiovascular: Normal rate, regular rhythm and normal heart sounds. Exam reveals no gallop and no friction rub.  No murmur heard.    Pulmonary/Chest: Effort normal and breath sounds normal. No respiratory distress. no wheezes.  Lymphadenopathy:  no cervical adenopathy.  Skin: Skin is warm and dry. No rash noted. No erythema.  Psychiatric: a normal mood and affect.  behavior is normal.       Assessment & Plan:  hiv = continue on stribild with food  Nausea= use phenergan prn  Health maintenance = pap due mid year

## 2013-10-25 ENCOUNTER — Ambulatory Visit (INDEPENDENT_AMBULATORY_CARE_PROVIDER_SITE_OTHER): Payer: Self-pay | Admitting: *Deleted

## 2013-10-25 DIAGNOSIS — Z124 Encounter for screening for malignant neoplasm of cervix: Secondary | ICD-10-CM

## 2013-10-25 DIAGNOSIS — Z113 Encounter for screening for infections with a predominantly sexual mode of transmission: Secondary | ICD-10-CM

## 2013-10-25 NOTE — Progress Notes (Signed)
  Subjective:     Colleen Walls is a 33 y.o. woman who comes in today for a  pap smear only.  Previous abnormal Pap smears: yes.  Menses:  Heavy for first couple of days, severe cramping.  Contraception:   Objective:    LMP 10/06/2013 Pelvic Exam:  Pap smear obtained.   Assessment:    Screening pap smear.   Plan:    Follow up in one year, or as indicated by Pap results.  Pt given educational materials re: HIV and women, self-esteem, BSE, nutrition and diet management, PAP smears and partner safety. Pt given condoms

## 2013-10-25 NOTE — Patient Instructions (Signed)
Your results will be ready in about a week.  I will send you a message in MyChart.  Thank you for coming to the Center for your care.  [name], RN 

## 2013-10-27 LAB — HIV-1 RNA QUANT-NO REFLEX-BLD

## 2013-10-28 ENCOUNTER — Other Ambulatory Visit: Payer: Self-pay | Admitting: *Deleted

## 2013-10-28 DIAGNOSIS — B20 Human immunodeficiency virus [HIV] disease: Secondary | ICD-10-CM

## 2013-10-28 LAB — T-HELPER CELL (CD4) - (RCID CLINIC ONLY)
CD4 % Helper T Cell: 18 % — ABNORMAL LOW (ref 33–55)
CD4 T Cell Abs: 280 /uL — ABNORMAL LOW (ref 400–2700)

## 2013-10-28 MED ORDER — ELVITEG-COBIC-EMTRICIT-TENOFDF 150-150-200-300 MG PO TABS: 1.0000 | ORAL_TABLET | Freq: Every day | ORAL | Status: DC

## 2013-11-01 ENCOUNTER — Encounter: Payer: Self-pay | Admitting: *Deleted

## 2013-11-27 ENCOUNTER — Other Ambulatory Visit: Payer: Self-pay | Admitting: *Deleted

## 2013-11-27 DIAGNOSIS — B20 Human immunodeficiency virus [HIV] disease: Secondary | ICD-10-CM

## 2013-11-27 MED ORDER — ELVITEG-COBIC-EMTRICIT-TENOFDF 150-150-200-300 MG PO TABS: 1.0000 | ORAL_TABLET | Freq: Every day | ORAL | Status: DC

## 2013-11-27 NOTE — Telephone Encounter (Signed)
ADAP application approved 

## 2013-12-05 ENCOUNTER — Telehealth: Payer: Self-pay | Admitting: *Deleted

## 2013-12-05 NOTE — Telephone Encounter (Signed)
Message copied by Gerome ApleyZEYFANG, LINDA L on Thu Dec 05, 2013 12:44 PM ------      Message from: Freddi StarrETHIER, JULIE N      Created: Thu Dec 05, 2013 10:17 AM       Patient is on my schedule for next week. Reason for visit says "needs to get pap smear done again" I see a normal pap results from January 2015 that was done by infectious disease. Can you please call patient and confirm reason for visit? Maybe this appointment was made before she saw them for her pap smear in January?             Thanks,             Raynelle FanningJulie ------

## 2013-12-05 NOTE — Telephone Encounter (Signed)
Called Colleen Walls and she is not sure why she needs a pap here- states she was told " by her regular doctor" to get a pap her. Per chart reivew had pap 10/2013-normal. Has history abnormal pap 09/2012 with colpo 10/2012.  Have message Jennet Maduroenise Estridge, RN to ask why referral for pap.

## 2013-12-12 NOTE — Telephone Encounter (Signed)
Confirmed with Dr. Jolayne Pantheronstant concerning the need of pap due to normal pap in January 2015.  Per Dr. Jolayne Pantheronstant pt does not need appt.  Called pt and I informed pt that if she does not have any other complaints then we can cancel appt.  Pt stated that she did not have any complaints.  I informed her that I would cancel appt. Pt agreed.

## 2013-12-13 ENCOUNTER — Ambulatory Visit: Payer: Self-pay | Admitting: Medical

## 2013-12-18 ENCOUNTER — Encounter: Payer: Self-pay | Admitting: Internal Medicine

## 2013-12-19 ENCOUNTER — Ambulatory Visit (INDEPENDENT_AMBULATORY_CARE_PROVIDER_SITE_OTHER): Payer: Self-pay | Admitting: Internal Medicine

## 2013-12-19 ENCOUNTER — Encounter: Payer: Self-pay | Admitting: Internal Medicine

## 2013-12-19 VITALS — BP 114/70 | HR 88 | Temp 98.3°F | Wt 152.0 lb

## 2013-12-19 DIAGNOSIS — L0232 Furuncle of buttock: Secondary | ICD-10-CM

## 2013-12-19 DIAGNOSIS — L0233 Carbuncle of buttock: Secondary | ICD-10-CM

## 2013-12-19 MED ORDER — DOXYCYCLINE HYCLATE 100 MG PO TABS: 100.0000 mg | ORAL_TABLET | Freq: Two times a day (BID) | ORAL | Status: DC

## 2013-12-19 MED ORDER — OXYCODONE-ACETAMINOPHEN 5-325 MG PO TABS: 1.0000 | ORAL_TABLET | Freq: Four times a day (QID) | ORAL | Status: DC | PRN

## 2013-12-19 NOTE — Telephone Encounter (Signed)
Spoke with patient and she was given appointment for today at 9:30 AM with Dr. Drue SecondSnider. Colleen Walls

## 2013-12-19 NOTE — Progress Notes (Signed)
   Subjective:    Patient ID: Colleen Walls, female    DOB: December 16, 1980, 33 y.o.   MRN: [id number]  HPI 33yo F with HIV, CD 4 count 280/VL < 20, on stribild, presents with SSTI on buttocks x 2 wk. Has been doing warm compresses but still causing significant pain for her. No drainage. She is not manipulating it.  Current Outpatient Prescriptions on File Prior to Visit  Medication Sig Dispense Refill  . albuterol (PROVENTIL HFA;VENTOLIN HFA) 108 (90 BASE) MCG/ACT inhaler Inhale 2 puffs into the lungs every 6 (six) hours as needed. For shortness of breath or wheeze      . elvitegravir-cobicistat-emtricitabine-tenofovir (STRIBILD) 150-150-200-300 MG TABS tablet Take 1 tablet by mouth daily with breakfast.  30 tablet  5  . promethazine (PHENERGAN) 25 MG tablet Take 1 tablet (25 mg total) by mouth every 6 (six) hours as needed for nausea.  20 tablet  3   No current facility-administered medications on file prior to visit.   Active Ambulatory Problems    Diagnosis Date Noted  . HIV (human immunodeficiency virus infection) 06/12/2012  . Boil 06/12/2012  . Asthma 06/12/2012  . ASCUS with positive high risk HPV 09/17/2012   Resolved Ambulatory Problems    Diagnosis Date Noted  . No Resolved Ambulatory Problems   Past Medical History  Diagnosis Date  . Abnormal Pap smear     Review of Systems  Constitutional: Negative for fever, chills, diaphoresis, activity change, appetite change, fatigue and unexpected weight change.  HENT: Negative for congestion, sore throat, rhinorrhea, sneezing, trouble swallowing and sinus pressure.  Eyes: Negative for photophobia and visual disturbance.  Respiratory: Negative for cough, chest tightness, shortness of breath, wheezing and stridor.  Cardiovascular: Negative for chest pain, palpitations and leg swelling.  Gastrointestinal: Negative for nausea, vomiting, abdominal pain, diarrhea, constipation, blood in stool, abdominal distention and anal bleeding.    Genitourinary: Negative for dysuria, hematuria, flank pain and difficulty urinating.  Musculoskeletal: Negative for myalgias, back pain, joint swelling, arthralgias and gait problem.  Skin: per hpi Neurological: Negative for dizziness, tremors, weakness and light-headedness.  Hematological: Negative for adenopathy. Does not bruise/bleed easily.  Psychiatric/Behavioral: Negative for behavioral problems, confusion, sleep disturbance, dysphoric mood, decreased concentration and agitation.       Objective:   Physical Exam BP 114/70  Pulse 88  Temp(Src) 98.3 F (36.8 C) (Oral)  Wt 152 lb (68.947 kg)  LMP 11/26/2013 Physical Exam  Constitutional:  oriented to person, place, and time. appears well-developed and well-nourished. In mild distress.  Skin: tenderness, induration to right upper buttock near gluteal cleft. Not flunctuant but exquisitely tender Psychiatric: a normal mood and affect. behavior is normal.       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 Will give antibiotic plus pain medication. Encourage hot compress   Nausea = intermittent use of phenergan

## 2014-01-23 ENCOUNTER — Ambulatory Visit (INDEPENDENT_AMBULATORY_CARE_PROVIDER_SITE_OTHER): Payer: Self-pay | Admitting: Internal Medicine

## 2014-01-23 ENCOUNTER — Encounter: Payer: Self-pay | Admitting: Internal Medicine

## 2014-01-23 VITALS — BP 122/75 | HR 88 | Temp 97.2°F | Wt 152.0 lb

## 2014-01-23 DIAGNOSIS — G4700 Insomnia, unspecified: Secondary | ICD-10-CM

## 2014-01-23 DIAGNOSIS — Z79899 Other long term (current) drug therapy: Secondary | ICD-10-CM

## 2014-01-23 DIAGNOSIS — B20 Human immunodeficiency virus [HIV] disease: Secondary | ICD-10-CM

## 2014-01-23 DIAGNOSIS — R110 Nausea: Secondary | ICD-10-CM

## 2014-01-23 DIAGNOSIS — Z113 Encounter for screening for infections with a predominantly sexual mode of transmission: Secondary | ICD-10-CM

## 2014-01-23 LAB — CBC WITH DIFFERENTIAL/PLATELET
BASOS PCT: 0 % (ref 0–1)
Basophils Absolute: 0 10*3/uL (ref 0.0–0.1)
EOS PCT: 2 % (ref 0–5)
Eosinophils Absolute: 0.1 10*3/uL (ref 0.0–0.7)
HEMATOCRIT: 38.2 % (ref 36.0–46.0)
Hemoglobin: 12.7 g/dL (ref 12.0–15.0)
Lymphocytes Relative: 38 % (ref 12–46)
Lymphs Abs: 1.6 10*3/uL (ref 0.7–4.0)
MCH: 28.8 pg (ref 26.0–34.0)
MCHC: 33.2 g/dL (ref 30.0–36.0)
MCV: 86.6 fL (ref 78.0–100.0)
MONO ABS: 0.5 10*3/uL (ref 0.1–1.0)
Monocytes Relative: 11 % (ref 3–12)
Neutro Abs: 2 10*3/uL (ref 1.7–7.7)
Neutrophils Relative %: 49 % (ref 43–77)
Platelets: 304 10*3/uL (ref 150–400)
RBC: 4.41 MIL/uL (ref 3.87–5.11)
RDW: 14.6 % (ref 11.5–15.5)
WBC: 4.1 10*3/uL (ref 4.0–10.5)

## 2014-01-23 LAB — COMPLETE METABOLIC PANEL WITH GFR
ALBUMIN: 4.2 g/dL (ref 3.5–5.2)
ALK PHOS: 82 U/L (ref 39–117)
ALT: 16 U/L (ref 0–35)
AST: 23 U/L (ref 0–37)
BUN: 9 mg/dL (ref 6–23)
CHLORIDE: 106 meq/L (ref 96–112)
CO2: 22 mEq/L (ref 19–32)
Calcium: 9.4 mg/dL (ref 8.4–10.5)
Creat: 0.98 mg/dL (ref 0.50–1.10)
GFR, EST NON AFRICAN AMERICAN: 76 mL/min
GFR, Est African American: 88 mL/min
GLUCOSE: 77 mg/dL (ref 70–99)
POTASSIUM: 4.2 meq/L (ref 3.5–5.3)
Sodium: 136 mEq/L (ref 135–145)
TOTAL PROTEIN: 7.8 g/dL (ref 6.0–8.3)
Total Bilirubin: 0.3 mg/dL (ref 0.2–1.2)

## 2014-01-23 LAB — LIPID PANEL
CHOL/HDL RATIO: 2.8 ratio
Cholesterol: 166 mg/dL (ref 0–200)
HDL: 60 mg/dL (ref >39)
LDL CALC: 96 mg/dL (ref 0–99)
Triglycerides: 52 mg/dL (ref <150)
VLDL: 10 mg/dL (ref 0–40)

## 2014-01-23 LAB — RPR

## 2014-01-23 NOTE — Progress Notes (Signed)
   Subjective:    Patient ID: Colleen Walls, female    DOB: [birth year], 33 y.o.   MRN: [id number]  HPI 32yo F with HIV, CD 4 count of 280/VL<20 on stribild. Continues to do well with excellent adherence. 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 She states that her boil improved since taking antibiotics at last visit 1 month ago, completely resolved. She still uses phenergan for nausea occ. She also noticed having insomnia in the last week, unclear if it related to stress. Has good sleep hygiene.  Current Outpatient Prescriptions on File Prior to Visit  Medication Sig Dispense Refill  . albuterol (PROVENTIL HFA;VENTOLIN HFA) 108 (90 BASE) MCG/ACT inhaler Inhale 2 puffs into the lungs every 6 (six) hours as needed. For shortness of breath or wheeze      . doxycycline (VIBRA-TABS) 100 MG tablet Take 1 tablet (100 mg total) by mouth 2 (two) times daily.  20 tablet  0  . elvitegravir-cobicistat-emtricitabine-tenofovir (STRIBILD) 150-150-200-300 MG TABS tablet Take 1 tablet by mouth daily with breakfast.  30 tablet  5  . oxyCODONE-acetaminophen (PERCOCET/ROXICET) 5-325 MG per tablet Take 1 tablet by mouth every 6 (six) hours as needed for severe pain.  30 tablet  0  . promethazine (PHENERGAN) 25 MG tablet Take 1 tablet (25 mg total) by mouth every 6 (six) hours as needed for nausea.  20 tablet  3   No current facility-administered medications on file prior to visit.     Active Ambulatory Problems    Diagnosis Date Noted  . HIV (human immunodeficiency virus infection) 06/12/2012  . Boil 06/12/2012  . Asthma 06/12/2012  . ASCUS with positive high risk HPV 09/17/2012   Resolved Ambulatory Problems    Diagnosis Date Noted  . No Resolved Ambulatory Problems   Past Medical History  Diagnosis Date  . Abnormal Pap smear     Soc hx: still smoking  Review of Systems 10 point ROS is negative except for insomnia nad nausea    Objective:   Physical  Exam BP 122/75  Pulse 88  Temp(Src) 97.2 F (36.2 C) (Oral)  Wt 152 lb (68.947 kg)  LMP 01/13/2014 Physical Exam  Constitutional:  oriented to person, place, and time. appears well-developed and well-nourished. No distress.  HENT:  Mouth/Throat: Oropharynx is clear and moist. No oropharyngeal exudate.  Cardiovascular: Normal rate, regular rhythm and normal heart sounds. Exam reveals no gallop and no friction rub.  No murmur heard.  Pulmonary/Chest: Effort normal and breath sounds normal. No respiratory distress.  has no wheezes.  Lymphadenopathy: no cervical adenopathy.  Neurological: alert and oriented to person, place, and time.  Skin: Skin is warm and dry. No rash noted. No erythema.  Psychiatric: a normal mood and affect. behavior is normal.      Assessment & Plan:  HIV = well controlled continue with stribild. 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 i asked her to call us next week if it still persists. May need to try low dose trazodone or ambien prn  Nausea = continue with prn phenergan  Health maintenance =will check rpr and lipid profile  rtc in 3 mo

## 2014-01-24 LAB — T-HELPER CELL (CD4) - (RCID CLINIC ONLY)
CD4 % Helper T Cell: 19 % — ABNORMAL LOW (ref 33–55)
CD4 T CELL ABS: 300 /uL — AB (ref 400–2700)

## 2014-01-24 LAB — HIV-1 RNA QUANT-NO REFLEX-BLD
HIV 1 RNA Quant: <20 copies/mL (ref <20)
HIV-1 RNA Quant, Log: <1.3 {Log} (ref <1.30)

## 2014-03-31 ENCOUNTER — Other Ambulatory Visit: Payer: Self-pay | Admitting: Internal Medicine

## 2014-03-31 DIAGNOSIS — B20 Human immunodeficiency virus [HIV] disease: Secondary | ICD-10-CM

## 2014-04-24 ENCOUNTER — Other Ambulatory Visit (INDEPENDENT_AMBULATORY_CARE_PROVIDER_SITE_OTHER): Payer: Self-pay

## 2014-04-24 DIAGNOSIS — B20 Human immunodeficiency virus [HIV] disease: Secondary | ICD-10-CM

## 2014-04-24 LAB — CBC WITH DIFFERENTIAL/PLATELET
Basophils Absolute: 0 10*3/uL (ref 0.0–0.1)
Basophils Relative: 1 % (ref 0–1)
EOS ABS: 0.1 10*3/uL (ref 0.0–0.7)
EOS PCT: 2 % (ref 0–5)
HCT: 36.5 % (ref 36.0–46.0)
HEMOGLOBIN: 12.4 g/dL (ref 12.0–15.0)
LYMPHS ABS: 1.8 10*3/uL (ref 0.7–4.0)
Lymphocytes Relative: 45 % (ref 12–46)
MCH: 28.9 pg (ref 26.0–34.0)
MCHC: 34 g/dL (ref 30.0–36.0)
MCV: 85.1 fL (ref 78.0–100.0)
MONOS PCT: 11 % (ref 3–12)
Monocytes Absolute: 0.4 10*3/uL (ref 0.1–1.0)
NEUTROS PCT: 41 % — AB (ref 43–77)
Neutro Abs: 1.6 10*3/uL — ABNORMAL LOW (ref 1.7–7.7)
PLATELETS: 320 10*3/uL (ref 150–400)
RBC: 4.29 MIL/uL (ref 3.87–5.11)
RDW: 14.3 % (ref 11.5–15.5)
WBC: 4 10*3/uL (ref 4.0–10.5)

## 2014-04-25 LAB — COMPREHENSIVE METABOLIC PANEL
ALK PHOS: 72 U/L (ref 39–117)
ALT: 20 U/L (ref 0–35)
AST: 30 U/L (ref 0–37)
Albumin: 3.8 g/dL (ref 3.5–5.2)
BILIRUBIN TOTAL: 0.5 mg/dL (ref 0.2–1.2)
BUN: 8 mg/dL (ref 6–23)
CO2: 25 meq/L (ref 19–32)
CREATININE: 1.09 mg/dL (ref 0.50–1.10)
Calcium: 9 mg/dL (ref 8.4–10.5)
Chloride: 106 mEq/L (ref 96–112)
GLUCOSE: 82 mg/dL (ref 70–99)
Potassium: 4.6 mEq/L (ref 3.5–5.3)
Sodium: 138 mEq/L (ref 135–145)
TOTAL PROTEIN: 6.9 g/dL (ref 6.0–8.3)

## 2014-04-25 LAB — HIV-1 RNA QUANT-NO REFLEX-BLD: HIV 1 RNA Quant: <20 copies/mL (ref <20)

## 2014-04-25 LAB — T-HELPER CELL (CD4) - (RCID CLINIC ONLY)
CD4 % Helper T Cell: 21 % — ABNORMAL LOW (ref 33–55)
CD4 T Cell Abs: 350 /uL — ABNORMAL LOW (ref 400–2700)

## 2014-05-08 ENCOUNTER — Ambulatory Visit: Payer: Self-pay | Admitting: Internal Medicine

## 2014-06-03 ENCOUNTER — Other Ambulatory Visit: Payer: Self-pay | Admitting: Internal Medicine

## 2014-06-17 ENCOUNTER — Ambulatory Visit: Payer: Self-pay | Admitting: Internal Medicine

## 2014-06-23 ENCOUNTER — Encounter: Payer: Self-pay | Admitting: Internal Medicine

## 2014-06-23 ENCOUNTER — Other Ambulatory Visit: Payer: Self-pay | Admitting: *Deleted

## 2014-06-23 ENCOUNTER — Ambulatory Visit (INDEPENDENT_AMBULATORY_CARE_PROVIDER_SITE_OTHER): Payer: Self-pay | Admitting: Internal Medicine

## 2014-06-23 VITALS — BP 118/71 | HR 78 | Temp 98.6°F | Ht 63.0 in | Wt 150.0 lb

## 2014-06-23 DIAGNOSIS — B20 Human immunodeficiency virus [HIV] disease: Secondary | ICD-10-CM

## 2014-06-23 DIAGNOSIS — Z23 Encounter for immunization: Secondary | ICD-10-CM

## 2014-06-23 MED ORDER — ELVITEG-COBIC-EMTRICIT-TENOFDF 150-150-200-300 MG PO TABS: 1.0000 | ORAL_TABLET | Freq: Every day | ORAL | Status: DC

## 2014-06-23 NOTE — Telephone Encounter (Signed)
ADAP Application 

## 2014-06-23 NOTE — Progress Notes (Signed)
Patient ID: Colleen Walls, female   DOB: 01-Nov-1980, 33 y.o.   MRN: [id number]       Patient ID: Colleen Walls, female   DOB: [birth year], 33 y.o.   MRN: [id number]  HPI  33yo F with HIV disease, CD 4 count of 350/VL<20 on stribild. Doing well with adherence. She denies any recent illnesses. No difficulty with her health.   Outpatient Encounter Prescriptions as of 06/23/2014  Medication Sig  . albuterol (PROVENTIL HFA;VENTOLIN HFA) 108 (90 BASE) MCG/ACT inhaler Inhale 2 puffs into the lungs every 6 (six) hours as needed. For shortness of breath or wheeze  . elvitegravir-cobicistat-emtricitabine-tenofovir (STRIBILD) 150-150-200-300 MG TABS tablet Take 1 tablet by mouth daily with breakfast.  . promethazine (PHENERGAN) 25 MG tablet TAKE 1 TABLET BY MOUTH EVERY 6 HOURS AS NEEDED FOR NAUSEA  . [DISCONTINUED] doxycycline (VIBRA-TABS) 100 MG tablet Take 1 tablet (100 mg total) by mouth 2 (two) times daily.  . [DISCONTINUED] oxyCODONE-acetaminophen (PERCOCET/ROXICET) 5-325 MG per tablet Take 1 tablet by mouth every 6 (six) hours as needed for severe pain.     Patient Active Problem List   Diagnosis Date Noted  . ASCUS with positive high risk HPV 09/17/2012  . HIV (human immunodeficiency virus infection) 06/12/2012  . Boil 06/12/2012  . Asthma 06/12/2012     Health Maintenance Due  Topic Date Due  . Influenza Vaccine  05/10/2014     Review of Systems 10 point ROS is negative Physical Exam   BP 118/71  Pulse 78  Temp(Src) 98.6 F (37 C) (Oral)  Ht  (1.6 m)  Wt 150 lb (68.04 kg)  BMI 26.58 kg/m2  LMP 06/02/2014 Physical Exam  Constitutional:  oriented to person, place, and time. appears well-developed and well-nourished. No distress.  HENT:  Mouth/Throat: Oropharynx is clear and moist. No oropharyngeal exudate.  Cardiovascular: Normal rate, regular rhythm and normal heart sounds. Exam reveals no gallop and no friction rub.  No murmur heard.  Pulmonary/Chest: Effort normal and  breath sounds normal. No respiratory distress.  has no wheezes.  Lymphadenopathy: no cervical adenopathy.  Neurological: alert and oriented to person, place, and time.  Skin: Skin is warm and dry. No rash noted. No erythema.   Lab Results  Component Value Date   CD4TCELL 21* 04/24/2014   Lab Results  Component Value Date   CD4TABS 350* 04/24/2014   CD4TABS 300* 01/23/2014   CD4TABS 280* 10/24/2013   Lab Results  Component Value Date   HIV1RNAQUANT <20 04/24/2014   Lab Results  Component Value Date   HEPBSAB POS* 06/12/2012   No results found for this basename: RPR    CBC Lab Results  Component Value Date   WBC 4.0 04/24/2014   RBC 4.29 04/24/2014   HGB 12.4 04/24/2014   HCT 36.5 04/24/2014   PLT 320 04/24/2014   MCV 85.1 04/24/2014   MCH 28.9 04/24/2014   MCHC 34.0 04/24/2014   RDW 14.3 04/24/2014   LYMPHSABS 1.8 04/24/2014   MONOABS 0.4 04/24/2014   EOSABS 0.1 04/24/2014   BASOSABS 0.0 04/24/2014   BMET Lab Results  Component Value Date   NA 138 04/24/2014   K 4.6 04/24/2014   CL 106 04/24/2014   CO2 25 04/24/2014   GLUCOSE 82 04/24/2014   BUN 8 04/24/2014   CREATININE 1.09 04/24/2014   CALCIUM 9.0 04/24/2014   GFRNONAA 76 01/23/2014   GFRAA 88 01/23/2014     Assessment and Plan   hiv = well controlled on stribild.  Health maintenance = will give flu vaccine today. rtc in 6 months. Pap at next visit

## 2014-08-04 ENCOUNTER — Emergency Department (HOSPITAL_COMMUNITY)
Admission: EM | Admit: 2014-08-04 | Discharge: 2014-08-04 | Disposition: A | Discharge status: Home / Self Care | Payer: Self-pay | Attending: Emergency Medicine | Admitting: Emergency Medicine

## 2014-08-04 ENCOUNTER — Encounter (HOSPITAL_COMMUNITY): Payer: Self-pay | Admitting: Emergency Medicine

## 2014-08-04 DIAGNOSIS — J029 Acute pharyngitis, unspecified: Secondary | ICD-10-CM | POA: Insufficient documentation

## 2014-08-04 DIAGNOSIS — H9203 Otalgia, bilateral: Secondary | ICD-10-CM | POA: Insufficient documentation

## 2014-08-04 DIAGNOSIS — Z720 Tobacco use: Secondary | ICD-10-CM | POA: Insufficient documentation

## 2014-08-04 DIAGNOSIS — D849 Immunodeficiency, unspecified: Secondary | ICD-10-CM | POA: Insufficient documentation

## 2014-08-04 DIAGNOSIS — J45909 Unspecified asthma, uncomplicated: Secondary | ICD-10-CM | POA: Insufficient documentation

## 2014-08-04 DIAGNOSIS — Z79899 Other long term (current) drug therapy: Secondary | ICD-10-CM | POA: Insufficient documentation

## 2014-08-04 LAB — RAPID STREP SCREEN (MED CTR MEBANE ONLY): STREPTOCOCCUS, GROUP A SCREEN (DIRECT): NEGATIVE

## 2014-08-04 MED ORDER — HYDROCODONE-ACETAMINOPHEN 7.5-325 MG/15ML PO SOLN: 10.0000 mL | Freq: Four times a day (QID) | ORAL | Status: DC | PRN

## 2014-08-04 MED ORDER — IBUPROFEN 400 MG PO TABS
800.0000 mg | ORAL_TABLET | Freq: Once | ORAL | Status: AC
  Administered 2014-08-04: 800 mg via ORAL
  Filled 2014-08-04: qty 2

## 2014-08-04 NOTE — Discharge Instructions (Signed)
Read the information below.  Use the prescribed medication as directed.  Please discuss all new medications with your pharmacist.  Do not take additional tylenol while taking the prescribed pain medication to avoid overdose.  You may return to the Emergency Department at any time for worsening condition or any new symptoms that concern you.  If you develop high fevers, difficulty swallowing or breathing, or you are unable to tolerate fluids by mouth, return to the ER immediately for a recheck.    ° ° °Pharyngitis °Pharyngitis is redness, pain, and swelling (inflammation) of your pharynx.  °CAUSES  °Pharyngitis is usually caused by infection. Most of the time, these infections are from viruses (viral) and are part of a cold. However, sometimes pharyngitis is caused by bacteria (bacterial). Pharyngitis can also be caused by allergies. Viral pharyngitis may be spread from person to person by coughing, sneezing, and personal items or utensils (cups, forks, spoons, toothbrushes). Bacterial pharyngitis may be spread from person to person by more intimate contact, such as kissing.  °SIGNS AND SYMPTOMS  °Symptoms of pharyngitis include:   °· Sore throat.   °· Tiredness (fatigue).   °· Low-grade fever.   °· Headache. °· Joint pain and muscle aches. °· Skin rashes. °· Swollen lymph nodes. °· Plaque-like film on throat or tonsils (often seen with bacterial pharyngitis). °DIAGNOSIS  °Your health care provider will ask you questions about your illness and your symptoms. Your medical history, along with a physical exam, is often all that is needed to diagnose pharyngitis. Sometimes, a rapid strep test is done. Other lab tests may also be done, depending on the suspected cause.  °TREATMENT  °Viral pharyngitis will usually get better in 3-4 days without the use of medicine. Bacterial pharyngitis is treated with medicines that kill germs (antibiotics).  °HOME CARE INSTRUCTIONS  °· Drink enough water and fluids to keep your urine  clear or pale yellow.   °· Only take over-the-counter or prescription medicines as directed by your health care provider:   °¨ If you are prescribed antibiotics, make sure you finish them even if you start to feel better.   °¨ Do not take aspirin.   °· Get lots of rest.   °· Gargle with 8 oz of salt water (½ tsp of salt per 1 qt of water) as often as every 1-2 hours to soothe your throat.   °· Throat lozenges (if you are not at risk for choking) or sprays may be used to soothe your throat. °SEEK MEDICAL CARE IF:  °· You have large, tender lumps in your neck. °· You have a rash. °· You cough up green, yellow-brown, or bloody spit. °SEEK IMMEDIATE MEDICAL CARE IF:  °· Your neck becomes stiff. °· You drool or are unable to swallow liquids. °· You vomit or are unable to keep medicines or liquids down. °· You have severe pain that does not go away with the use of recommended medicines. °· You have trouble breathing (not caused by a stuffy nose). °MAKE SURE YOU:  °· Understand these instructions. °· Will watch your condition. °· Will get help right away if you are not doing well or get worse. °Document Released: 09/26/2005 Document Revised: 07/17/2013 Document Reviewed: 06/03/2013 °[employer]® Patient Information ©2015 [employer]. This information is not intended to replace advice given to you by your health care provider. Make sure you discuss any questions you have with your health care provider. ° °

## 2014-08-04 NOTE — ED Provider Notes (Signed)
CSN: [id number]     Arrival date & time 08/04/14  0900 History  This chart was scribed for non-physician practitioner Trixie DredgeEmily Taneil Lazarus, PA-C working with Doug SouSam Jacubowitz, MD by Leone PayorSonum Patel, ED Scribe. This patient was seen in room TR10C/TR10C and the patient's care was started at 11:47 AM.    Chief Complaint  Patient presents with  . Sore Throat  . Otalgia   The history is provided by the patient. No language interpreter was used.    HPI Comments: Colleen Walls is a 33 y.o. female who presents to the Emergency Department complaining of 7 days of gradual onset, gradually worsening, constant sore throat with associated bilateral otalgia and hoarse voice. She reports an associated, non-productive cough which began yesterday. She describes the pain as a soreness and states it is worse with swallowing. She has tried OTC throat sprays without relief. She denies history of strep throat. She denies fever, SOB, chest pain, congestion, rhinorrhea, abdominal pain, nausea, vomiting, diarrhea.    Past Medical History  Diagnosis Date  . Asthma   . Abnormal Pap smear    History reviewed. No pertinent past surgical history. No family history on file. History  Substance Use Topics  . Smoking status: Current Every Day Smoker -- 1.00 packs/day for 5 years    Types: Cigarettes  . Smokeless tobacco: Never Used     Comment: setting "quit" date for March; interested in opiton  . Alcohol Use: No     Comment: occasional   OB History   Grav Para Term Preterm Abortions TAB SAB Ect Mult Living   3 3 3       3      Review of Systems  Constitutional: Negative for fever.  HENT: Positive for sore throat. Negative for congestion, rhinorrhea and trouble swallowing.   Respiratory: Positive for cough. Negative for shortness of breath.   Cardiovascular: Negative for chest pain.  Gastrointestinal: Negative for vomiting, abdominal pain and diarrhea.  Allergic/Immunologic: Positive for immunocompromised state.  All other  systems reviewed and are negative.     Allergies  Review of patient's allergies indicates no known allergies.  Home Medications   Prior to Admission medications   Medication Sig Start Date End Date Taking? Authorizing Provider  albuterol (PROVENTIL HFA;VENTOLIN HFA) 108 (90 BASE) MCG/ACT inhaler Inhale 2 puffs into the lungs every 6 (six) hours as needed. For shortness of breath or wheeze    Historical Provider, MD  elvitegravir-cobicistat-emtricitabine-tenofovir (STRIBILD) 150-150-200-300 MG TABS tablet Take 1 tablet by mouth daily with breakfast. 06/23/14   Gardiner Barefootobert W Comer, MD  promethazine (PHENERGAN) 25 MG tablet TAKE 1 TABLET BY MOUTH EVERY 6 HOURS AS NEEDED FOR NAUSEA 03/31/14   Judyann Munsonynthia Snider, MD   BP 125/88  Pulse 76  Temp(Src) 97.8 F (36.6 C) (Oral)  Resp 18  SpO2 100% Physical Exam  Nursing note and vitals reviewed. Constitutional: She appears well-developed and well-nourished. No distress.  HENT:  Head: Normocephalic and atraumatic.  Right Ear: Tympanic membrane and ear canal normal.  Left Ear: Tympanic membrane and ear canal normal.  Mouth/Throat: Posterior oropharyngeal erythema present. No oropharyngeal exudate or posterior oropharyngeal edema.  Posterior oropharynx erythema but no edema or exudate.   Neck: Normal range of motion. Neck supple.  Cardiovascular: Normal rate, regular rhythm and normal heart sounds.   Pulmonary/Chest: Effort normal and breath sounds normal. No stridor. No respiratory distress. She has no wheezes. She has no rales.  Lymphadenopathy:    She has cervical adenopathy.  Neurological: She  is alert.  Skin: She is not diaphoretic.    ED Course  Procedures (including critical care time)  DIAGNOSTIC STUDIES: Oxygen Saturation is 100% on RA, normal by my interpretation.    COORDINATION OF CARE: 11:51 AM Discussed treatment plan with pt at bedside and pt agreed to plan.   Labs Review Labs Reviewed  RAPID STREP SCREEN  CULTURE, GROUP A  STREP    Imaging Review No results found.   EKG Interpretation None      Last CD4 is 350.  MDM   Final diagnoses:  Pharyngitis    Afebrile, nontoxic patient with sore throat, bilateral ear pain.  Likely viral pharyngitis.  Exam unremarkable.  Last CD4 350, taking all medications.  Strep culture pending.    D/C home with lortab elixir for pain, recommendations for hydration, ID follow up (pt has no PCP).   Discussed result, findings, treatment, and follow up  with patient.  Pt given return precautions.  Pt verbalizes understanding and agrees with plan.       I personally performed the services described in this documentation, which was scribed in my presence. The recorded information has been reviewed and is accurate.   Trixie Dredgemily Wilma Michaelson, PA-C 08/04/14 1321

## 2014-08-04 NOTE — ED Provider Notes (Signed)
Medical screening examination/treatment/procedure(s) were performed by non-physician practitioner and as supervising physician I was immediately available for consultation/collaboration.   EKG Interpretation None       Doug SouSam Dontrey Snellgrove, MD 08/04/14 [phone]

## 2014-08-04 NOTE — ED Notes (Signed)
Patient states had sore throat x 1 week.   Patient states now both of her ears hurt.   Patient states no fever, no other symptoms.

## 2014-08-06 LAB — CULTURE, GROUP A STREP

## 2014-08-11 ENCOUNTER — Encounter (HOSPITAL_COMMUNITY): Payer: Self-pay | Admitting: Emergency Medicine

## 2014-08-18 ENCOUNTER — Encounter: Payer: Self-pay | Admitting: Internal Medicine

## 2014-08-20 ENCOUNTER — Ambulatory Visit (INDEPENDENT_AMBULATORY_CARE_PROVIDER_SITE_OTHER): Payer: Self-pay | Admitting: Internal Medicine

## 2014-08-20 VITALS — BP 134/79 | HR 93 | Temp 98.4°F | Wt 150.0 lb

## 2014-08-20 DIAGNOSIS — B20 Human immunodeficiency virus [HIV] disease: Secondary | ICD-10-CM

## 2014-08-20 DIAGNOSIS — Z21 Asymptomatic human immunodeficiency virus [HIV] infection status: Secondary | ICD-10-CM

## 2014-08-20 DIAGNOSIS — J029 Acute pharyngitis, unspecified: Secondary | ICD-10-CM

## 2014-08-20 LAB — COMPLETE METABOLIC PANEL WITH GFR
ALBUMIN: 3.9 g/dL (ref 3.5–5.2)
ALT: 18 U/L (ref 0–35)
AST: 24 U/L (ref 0–37)
Alkaline Phosphatase: 75 U/L (ref 39–117)
BUN: 7 mg/dL (ref 6–23)
CHLORIDE: 107 meq/L (ref 96–112)
CO2: 24 mEq/L (ref 19–32)
CREATININE: 0.95 mg/dL (ref 0.50–1.10)
Calcium: 9.1 mg/dL (ref 8.4–10.5)
GFR, Est African American: >89 mL/min
GFR, Est Non African American: 78 mL/min
Glucose, Bld: 78 mg/dL (ref 70–99)
POTASSIUM: 3.8 meq/L (ref 3.5–5.3)
Sodium: 138 mEq/L (ref 135–145)
Total Bilirubin: 0.2 mg/dL (ref 0.2–1.2)
Total Protein: 7.1 g/dL (ref 6.0–8.3)

## 2014-08-20 NOTE — Progress Notes (Signed)
Patient ID: Colleen Walls, female   DOB: May 22, 1981, 33 y.o.   MRN: [id number]       Patient ID: Colleen Walls, female   DOB: May 22, 1981, 33 y.o.   MRN: [id number]  HPI 33yo F with HIV disease, CD4 count of 350/VL<20 (july) on stribild. She has had viral pharyngitis for the past 2.5-3wk. Went to ED given pain medications, throat cx negative. Still having loss of voice and mild sore throat. Continues to use over the counter medicaiton for symptomatic relief. No fevers. No sick contacts. Some ear pain occasionally.  Outpatient Encounter Prescriptions as of 08/20/2014  Medication Sig  . albuterol (PROVENTIL HFA;VENTOLIN HFA) 108 (90 BASE) MCG/ACT inhaler Inhale 2 puffs into the lungs every 6 (six) hours as needed. For shortness of breath or wheeze  . elvitegravir-cobicistat-emtricitabine-tenofovir (STRIBILD) 150-150-200-300 MG TABS tablet Take 1 tablet by mouth daily with breakfast.  . promethazine (PHENERGAN) 25 MG tablet TAKE 1 TABLET BY MOUTH EVERY 6 HOURS AS NEEDED FOR NAUSEA  . HYDROcodone-acetaminophen (HYCET) 7.5-325 mg/15 ml solution Take 10 mLs by mouth 4 (four) times daily as needed for moderate pain or severe pain.     Patient Active Problem List   Diagnosis Date Noted  . ASCUS with positive high risk HPV 09/17/2012  . HIV (human immunodeficiency virus infection) 06/12/2012  . Boil 06/12/2012  . Asthma 06/12/2012     There are no preventive care reminders to display for this patient.   Review of Systems Positive pertinents listed in hpi Physical Exam   BP 134/79 mmHg  Pulse 93  Temp(Src) 98.4 F (36.9 C) (Oral)  Wt 150 lb (68.04 kg)  LMP 08/04/2014 Physical Exam  Constitutional:  oriented to person, place, and time. appears well-developed and well-nourished. No distress.  HENT: TM bilaterally are clear Mouth/Throat: Oropharynx is clear and moist. No oropharyngeal exudate. Tonsils are not enlarged or inflamed Cardiovascular: Normal rate, regular rhythm and normal heart  sounds. Exam reveals no gallop and no friction rub.  No murmur heard.  Pulmonary/Chest: Effort normal and breath sounds normal. No respiratory distress.  has no wheezes.  Abdominal: Soft. Bowel sounds are normal.  exhibits no distension. There is no tenderness.  Lymphadenopathy: no cervical adenopathy.  Neurological: alert and oriented to person, place, and time.  Skin: Skin is warm and dry. No rash noted. No erythema.  Psychiatric: a normal mood and affect. His behavior is normal.   Lab Results  Component Value Date   CD4TCELL 21* 04/24/2014   Lab Results  Component Value Date   CD4TABS 350* 04/24/2014   CD4TABS 300* 01/23/2014   CD4TABS 280* 10/24/2013   Lab Results  Component Value Date   HIV1RNAQUANT <20 04/24/2014   Lab Results  Component Value Date   HEPBSAB POS* 06/12/2012   No results found for: RPR  CBC Lab Results  Component Value Date   WBC 4.0 04/24/2014   RBC 4.29 04/24/2014   HGB 12.4 04/24/2014   HCT 36.5 04/24/2014   PLT 320 04/24/2014   MCV 85.1 04/24/2014   MCH 28.9 04/24/2014   MCHC 34.0 04/24/2014   RDW 14.3 04/24/2014   LYMPHSABS 1.8 04/24/2014   MONOABS 0.4 04/24/2014   EOSABS 0.1 04/24/2014   BASOSABS 0.0 04/24/2014   BMET Lab Results  Component Value Date   NA 138 04/24/2014   K 4.6 04/24/2014   CL 106 04/24/2014   CO2 25 04/24/2014   GLUCOSE 82 04/24/2014   BUN 8 04/24/2014   CREATININE 1.09 04/24/2014  CALCIUM 9.0 04/24/2014   GFRNONAA 76 01/23/2014   GFRAA 88 01/23/2014     Assessment and Plan  hiv = well controlled. Continue on stribild  Health maintenance = had flu vaccination already  Viral pharyngitis = slow to improve. Will recommend 2-3 d of scheduled nsaids to see if it improves. Continue with otc meds  Ascus = needs pap in January 2016  rtc in 3 months

## 2014-08-21 LAB — CBC WITH DIFFERENTIAL/PLATELET
BASOS ABS: 0 10*3/uL (ref 0.0–0.1)
Basophils Relative: 0 % (ref 0–1)
Eosinophils Absolute: 0 10*3/uL (ref 0.0–0.7)
Eosinophils Relative: 1 % (ref 0–5)
HCT: 35.1 % — ABNORMAL LOW (ref 36.0–46.0)
Hemoglobin: 12 g/dL (ref 12.0–15.0)
Lymphocytes Relative: 45 % (ref 12–46)
Lymphs Abs: 2.2 10*3/uL (ref 0.7–4.0)
MCH: 29 pg (ref 26.0–34.0)
MCHC: 34.2 g/dL (ref 30.0–36.0)
MCV: 84.8 fL (ref 78.0–100.0)
Monocytes Absolute: 0.5 10*3/uL (ref 0.1–1.0)
Monocytes Relative: 10 % (ref 3–12)
Neutro Abs: 2.1 10*3/uL (ref 1.7–7.7)
Neutrophils Relative %: 44 % (ref 43–77)
PLATELETS: 378 10*3/uL (ref 150–400)
RBC: 4.14 MIL/uL (ref 3.87–5.11)
RDW: 13.7 % (ref 11.5–15.5)
WBC: 4.8 10*3/uL (ref 4.0–10.5)

## 2014-08-22 LAB — T-HELPER CELL (CD4) - (RCID CLINIC ONLY)
CD4 % Helper T Cell: 24 % — ABNORMAL LOW (ref 33–55)
CD4 T Cell Abs: 530 /uL (ref 400–2700)

## 2014-08-22 LAB — HIV-1 RNA QUANT-NO REFLEX-BLD: HIV 1 RNA Quant: <20 copies/mL (ref <20)

## 2014-10-01 ENCOUNTER — Other Ambulatory Visit: Payer: Self-pay | Admitting: Internal Medicine

## 2014-10-01 DIAGNOSIS — R110 Nausea: Secondary | ICD-10-CM

## 2014-10-28 ENCOUNTER — Other Ambulatory Visit: Payer: Self-pay | Admitting: Internal Medicine

## 2014-11-20 ENCOUNTER — Ambulatory Visit: Payer: Self-pay | Admitting: Internal Medicine

## 2015-02-11 ENCOUNTER — Ambulatory Visit: Payer: Self-pay | Admitting: Internal Medicine

## 2015-02-12 ENCOUNTER — Encounter: Payer: Self-pay | Admitting: Internal Medicine

## 2015-02-12 ENCOUNTER — Ambulatory Visit (INDEPENDENT_AMBULATORY_CARE_PROVIDER_SITE_OTHER): Payer: Self-pay | Admitting: Internal Medicine

## 2015-02-12 VITALS — BP 119/85 | HR 78 | Temp 98.1°F | Wt 152.0 lb

## 2015-02-12 DIAGNOSIS — N920 Excessive and frequent menstruation with regular cycle: Secondary | ICD-10-CM

## 2015-02-12 DIAGNOSIS — IMO0002 Reserved for concepts with insufficient information to code with codable children: Secondary | ICD-10-CM

## 2015-02-12 DIAGNOSIS — R896 Abnormal cytological findings in specimens from other organs, systems and tissues: Secondary | ICD-10-CM

## 2015-02-12 DIAGNOSIS — B20 Human immunodeficiency virus [HIV] disease: Secondary | ICD-10-CM

## 2015-02-12 MED ORDER — LEVONORGESTREL-ETHINYL ESTRAD 0.15-30 MG-MCG PO TABS: 1.0000 | ORAL_TABLET | Freq: Every day | ORAL | Status: DC

## 2015-02-12 NOTE — Progress Notes (Signed)
Patient ID: Colleen Walls, female   DOB: April 04, 1981, 34 y.o.   MRN: [id number]       Patient ID: Colleen Glowicole Beason, female   DOB: April 04, 1981, 34 y.o.   MRN: [id number]  HPI 34yo F with HIV disease, CD 4 count of 530/VL<20 (nov 2015) who is on stribild. She is doing well with adherence. She was briefly in [place] tending to her ill mother, but now back in [place]. She is doing well, no complaints with her health. She recovered from having uri  ROS:  - heavy periods with cramping  Outpatient Encounter Prescriptions as of 02/12/2015  Medication Sig  . albuterol (PROVENTIL HFA;VENTOLIN HFA) 108 (90 BASE) MCG/ACT inhaler Inhale 2 puffs into the lungs every 6 (six) hours as needed. For shortness of breath or wheeze  . STRIBILD 150-150-200-300 MG TABS tablet TAKE 1 TABLET BY MOUTH EVERY DAY WITH BREAKFAST  . HYDROcodone-acetaminophen (HYCET) 7.5-325 mg/15 ml solution Take 10 mLs by mouth 4 (four) times daily as needed for moderate pain or severe pain. (Patient not taking: Reported on 02/12/2015)  . promethazine (PHENERGAN) 25 MG tablet TAKE 1 TABLET BY MOUTH EVERY 6 HOURS AS NEEDED FOR NAUSEA (Patient not taking: Reported on 02/12/2015)   No facility-administered encounter medications on file as of 02/12/2015.     Patient Active Problem List   Diagnosis Date Noted  . ASCUS with positive high risk HPV 09/17/2012  . HIV (human immunodeficiency virus infection) 06/12/2012  . Boil 06/12/2012  . Asthma 06/12/2012     There are no preventive care reminders to display for this patient.   Review of Systems 10 point ros negative, except for abdominal cramping associated with menses and menorrhagia Physical Exam   BP 119/85 mmHg  Pulse 78  Temp(Src) 98.1 F (36.7 C) (Oral)  Wt 152 lb (68.947 kg)  LMP 01/13/2015 Physical Exam  Constitutional:  oriented to person, place, and time. appears well-developed and well-nourished. No distress.  HENT: [place]/AT, PERRLA, no scleral icterus Mouth/Throat: Oropharynx is  clear and moist. No oropharyngeal exudate.  Cardiovascular: Normal rate, regular rhythm and normal heart sounds. Exam reveals no gallop and no friction rub.  No murmur heard.  Pulmonary/Chest: Effort normal and breath sounds normal. No respiratory distress.  has no wheezes.  Neck = supple, no nuchal rigidity  Lymphadenopathy: no cervical adenopathy. No axillary adenopathy Skin: Skin is warm and dry. No rash noted. No erythema.  Psychiatric: a normal mood and affect.  behavior is normal.   Lab Results  Component Value Date   CD4TCELL 24* 08/20/2014   Lab Results  Component Value Date   CD4TABS 530 08/20/2014   CD4TABS 350* 04/24/2014   CD4TABS 300* 01/23/2014   Lab Results  Component Value Date   HIV1RNAQUANT <20 08/20/2014   Lab Results  Component Value Date   HEPBSAB POS* 06/12/2012   No results found for: RPR  CBC Lab Results  Component Value Date   WBC 4.8 08/20/2014   RBC 4.14 08/20/2014   HGB 12.0 08/20/2014   HCT 35.1* 08/20/2014   PLT 378 08/20/2014   MCV 84.8 08/20/2014   MCH 29.0 08/20/2014   MCHC 34.2 08/20/2014   RDW 13.7 08/20/2014   LYMPHSABS 2.2 08/20/2014   MONOABS 0.5 08/20/2014   EOSABS 0.0 08/20/2014   BASOSABS 0.0 08/20/2014   BMET Lab Results  Component Value Date   NA 138 08/20/2014   K 3.8 08/20/2014   CL 107 08/20/2014   CO2 24 08/20/2014   GLUCOSE 78  08/20/2014   BUN 7 08/20/2014   CREATININE 0.95 08/20/2014   CALCIUM 9.1 08/20/2014   GFRNONAA 78 08/20/2014   GFRAA >89 08/20/2014     Assessment and Plan  hiv disease = will get blood work today, well controlled at last blood draw.  Menorrhagia = will start oral birthcontrol pills to see if it improves her symptoms  ASUCS with + high risk HPV = Needs pap  rtc in 3 months

## 2015-02-16 ENCOUNTER — Other Ambulatory Visit: Payer: Self-pay | Admitting: *Deleted

## 2015-02-16 DIAGNOSIS — B20 Human immunodeficiency virus [HIV] disease: Secondary | ICD-10-CM

## 2015-02-16 MED ORDER — ELVITEG-COBIC-EMTRICIT-TENOFDF 150-150-200-300 MG PO TABS: ORAL_TABLET | ORAL | Status: DC

## 2015-02-16 NOTE — Telephone Encounter (Signed)
ADAP Application 

## 2015-02-20 ENCOUNTER — Telehealth: Payer: Self-pay | Admitting: *Deleted

## 2015-02-20 ENCOUNTER — Ambulatory Visit: Payer: Self-pay

## 2015-02-20 NOTE — Telephone Encounter (Signed)
Pt forgot her appt.  Made a new appt for Fri., May 27 @ 0900.

## 2015-03-06 ENCOUNTER — Ambulatory Visit (INDEPENDENT_AMBULATORY_CARE_PROVIDER_SITE_OTHER): Payer: Self-pay | Admitting: *Deleted

## 2015-03-06 DIAGNOSIS — Z124 Encounter for screening for malignant neoplasm of cervix: Secondary | ICD-10-CM

## 2015-03-06 DIAGNOSIS — Z113 Encounter for screening for infections with a predominantly sexual mode of transmission: Secondary | ICD-10-CM

## 2015-03-06 NOTE — Progress Notes (Signed)
  Subjective:     Colleen Walls is a 34 y.o. woman who comes in today for a  pap smear only.  Previous abnormal Pap smears: no. Contraception: BC pills, condoms.  Objective:  LMP 02/26/15, slight whitish,creamy vaginal discharge  Pelvic Exam:  Pap smear obtained.   Assessment:    Screening pap smear.   Plan:    Follow up in one year, or as indicated by Pap results.  Pt given educational materials re: HIV and women, self-esteem, BSE, nutrition and diet management, PAP smears and partner safety. Pt given condoms.

## 2015-03-06 NOTE — Patient Instructions (Signed)
Your results will be ready in about a week.  I will mail them to you.  Thank you for coming to the Center for your care today.  Angelique Blonderenise, RN

## 2015-03-10 LAB — CYTOLOGY - PAP

## 2015-03-19 ENCOUNTER — Encounter: Payer: Self-pay | Admitting: Internal Medicine

## 2015-03-21 ENCOUNTER — Emergency Department (HOSPITAL_COMMUNITY)
Admission: EM | Admit: 2015-03-21 | Discharge: 2015-03-21 | Disposition: A | Discharge status: Home / Self Care | Payer: Self-pay | Attending: Emergency Medicine | Admitting: Emergency Medicine

## 2015-03-21 ENCOUNTER — Emergency Department (HOSPITAL_COMMUNITY): Payer: Self-pay

## 2015-03-21 ENCOUNTER — Encounter (HOSPITAL_COMMUNITY): Payer: Self-pay | Admitting: *Deleted

## 2015-03-21 DIAGNOSIS — K088 Other specified disorders of teeth and supporting structures: Secondary | ICD-10-CM | POA: Insufficient documentation

## 2015-03-21 DIAGNOSIS — Z21 Asymptomatic human immunodeficiency virus [HIV] infection status: Secondary | ICD-10-CM | POA: Insufficient documentation

## 2015-03-21 DIAGNOSIS — K0889 Other specified disorders of teeth and supporting structures: Secondary | ICD-10-CM

## 2015-03-21 DIAGNOSIS — J45909 Unspecified asthma, uncomplicated: Secondary | ICD-10-CM | POA: Insufficient documentation

## 2015-03-21 DIAGNOSIS — K029 Dental caries, unspecified: Secondary | ICD-10-CM | POA: Insufficient documentation

## 2015-03-21 DIAGNOSIS — Z3202 Encounter for pregnancy test, result negative: Secondary | ICD-10-CM | POA: Insufficient documentation

## 2015-03-21 LAB — CBC WITH DIFFERENTIAL/PLATELET
BASOS PCT: 1 % (ref 0–1)
Basophils Absolute: 0 10*3/uL (ref 0.0–0.1)
Eosinophils Absolute: 0.1 10*3/uL (ref 0.0–0.7)
Eosinophils Relative: 2 % (ref 0–5)
HCT: 39.5 % (ref 36.0–46.0)
HEMOGLOBIN: 12.7 g/dL (ref 12.0–15.0)
Lymphocytes Relative: 31 % (ref 12–46)
Lymphs Abs: 1.9 10*3/uL (ref 0.7–4.0)
MCH: 28.9 pg (ref 26.0–34.0)
MCHC: 32.2 g/dL (ref 30.0–36.0)
MCV: 90 fL (ref 78.0–100.0)
MONO ABS: 0.5 10*3/uL (ref 0.1–1.0)
MONOS PCT: 8 % (ref 3–12)
NEUTROS ABS: 3.7 10*3/uL (ref 1.7–7.7)
NEUTROS PCT: 58 % (ref 43–77)
Platelets: 328 10*3/uL (ref 150–400)
RBC: 4.39 MIL/uL (ref 3.87–5.11)
RDW: 13.6 % (ref 11.5–15.5)
WBC: 6.3 10*3/uL (ref 4.0–10.5)

## 2015-03-21 LAB — BASIC METABOLIC PANEL
Anion gap: 8 (ref 5–15)
BUN: 10 mg/dL (ref 6–20)
CO2: 23 mmol/L (ref 22–32)
Calcium: 9.4 mg/dL (ref 8.9–10.3)
Chloride: 104 mmol/L (ref 101–111)
Creatinine, Ser: 0.89 mg/dL (ref 0.44–1.00)
GFR calc non Af Amer: >60 mL/min (ref >60)
Glucose, Bld: 93 mg/dL (ref 65–99)
Potassium: 4.1 mmol/L (ref 3.5–5.1)
SODIUM: 135 mmol/L (ref 135–145)

## 2015-03-21 LAB — POC URINE PREG, ED: Preg Test, Ur: NEGATIVE

## 2015-03-21 MED ORDER — CLINDAMYCIN HCL 150 MG PO CAPS: 450.0000 mg | ORAL_CAPSULE | Freq: Three times a day (TID) | ORAL | Status: DC

## 2015-03-21 MED ORDER — IOHEXOL 300 MG/ML  SOLN
75.0000 mL | Freq: Once | INTRAMUSCULAR | Status: AC | PRN
  Administered 2015-03-21: 75 mL via INTRAVENOUS

## 2015-03-21 MED ORDER — ACETAMINOPHEN 500 MG PO TABS: 500.0000 mg | ORAL_TABLET | Freq: Four times a day (QID) | ORAL | Status: DC | PRN

## 2015-03-21 MED ORDER — BUPIVACAINE-EPINEPHRINE (PF) 0.5% -1:200000 IJ SOLN
1.8000 mL | Freq: Once | INTRAMUSCULAR | Status: AC
  Administered 2015-03-21: 1.8 mL

## 2015-03-21 MED ORDER — HYDROCODONE-ACETAMINOPHEN 5-325 MG PO TABS
1.0000 | ORAL_TABLET | Freq: Once | ORAL | Status: AC
  Administered 2015-03-21: 1 via ORAL
  Filled 2015-03-21: qty 1

## 2015-03-21 NOTE — ED Notes (Signed)
Pt up to the restroom for  Urine specimen

## 2015-03-21 NOTE — ED Provider Notes (Signed)
Dental Date/Time: 03/21/2015 12:01 PM Performed by: Dierdre Forth Authorized by: Dierdre Forth Consent: Verbal consent obtained. Written consent obtained. Risks and benefits: risks, benefits and alternatives were discussed Consent given by: patient Patient understanding: patient states understanding of the procedure being performed Patient consent: the patient's understanding of the procedure matches consent given Procedure consent: procedure consent matches procedure scheduled Relevant documents: relevant documents present and verified Site marked: the operative site was marked Required items: required blood products, implants, devices, and special equipment available Patient identity confirmed: arm band and verbally with patient Time out: Immediately prior to procedure a "time out" was called to verify the correct patient, procedure, equipment, support staff and site/side marked as required. Preparation: Patient was prepped and draped in the usual sterile fashion. Local anesthesia used: yes Local anesthetic: bupivacaine 0.5% with epinephrine Anesthetic total: 1.8 ml Patient sedated: no Patient tolerance: Patient tolerated the procedure well with no immediate complications Comments: Total dental block of Tooth #1  Dierdre Forth, PA-C 03/21/15 1204  Gwyneth Sprout, MD 03/22/15 [phone]

## 2015-03-21 NOTE — ED Provider Notes (Signed)
CSN: [id number]     Arrival date & time 03/21/15  0732 History   First MD Initiated Contact with Patient 03/21/15 0746     Chief Complaint  Patient presents with  . Dental Pain     (Consider location/radiation/quality/duration/timing/severity/associated sxs/prior Treatment) HPI   Pt is a 34 year old female with history of HIV and asthma who presents to the ED for dental pain that began 5 days ago and has gradually gotten worse with increased severity of pain, swelling, and difficulty eating.  She has not had any fever, chills, nausea, vomiting.  Past Medical History  Diagnosis Date  . Asthma   . Abnormal Pap smear    History reviewed. No pertinent past surgical history. History reviewed. No pertinent family history. History  Substance Use Topics  . Smoking status: Current Every Day Smoker -- 1.00 packs/day for 5 years    Types: Cigarettes  . Smokeless tobacco: Never Used     Comment: setting "quit" date for March; interested in opiton  . Alcohol Use: Yes     Comment: occasional   OB History    Gravida Para Term Preterm AB TAB SAB Ectopic Multiple Living   3 3 3       3      Review of Systems  All other systems reviewed and are negative.     Allergies  Review of patient's allergies indicates no known allergies.  Home Medications   Prior to Admission medications   Medication Sig Start Date End Date Taking? Authorizing Provider  acetaminophen (TYLENOL) 500 MG tablet Take 1 tablet (500 mg total) by mouth every 6 (six) hours as needed. 03/21/15   Ladona Mow, PA-C  albuterol (PROVENTIL HFA;VENTOLIN HFA) 108 (90 BASE) MCG/ACT inhaler Inhale 2 puffs into the lungs every 6 (six) hours as needed. For shortness of breath or wheeze    Historical Provider, MD  clindamycin (CLEOCIN) 150 MG capsule Take 3 capsules (450 mg total) by mouth 3 (three) times daily. 03/21/15   Ladona Mow, PA-C  elvitegravir-cobicistat-emtricitabine-tenofovir (STRIBILD) 150-150-200-300 MG TABS tablet TAKE 1  TABLET BY MOUTH EVERY DAY WITH BREAKFAST 02/16/15   Randall Hiss, MD  HYDROcodone-acetaminophen (HYCET) 7.5-325 mg/15 ml solution Take 10 mLs by mouth 4 (four) times daily as needed for moderate pain or severe pain. Patient not taking: Reported on 02/12/2015 08/04/14   Trixie Dredge, PA-C  levonorgestrel-ethinyl estradiol (NORDETTE) 0.15-30 MG-MCG tablet Take 1 tablet by mouth daily. 02/12/15   Judyann Munson, MD  promethazine (PHENERGAN) 25 MG tablet TAKE 1 TABLET BY MOUTH EVERY 6 HOURS AS NEEDED FOR NAUSEA Patient not taking: Reported on 02/12/2015 10/01/14   Judyann Munson, MD   BP 94/76 mmHg  Pulse 77  Temp(Src) 98.4 F (36.9 C) (Oral)  Resp 18  SpO2 100%  LMP 03/01/2015 Physical Exam   Constitutional: She is oriented to person, place, and time. She appears well-developed and well-nourished. No distress.  HENT:  Head: Normocephalic and atraumatic.  Multiple dental caries, no obvious abscess noted. Gums on right maxilla and mandible tender to palpation Mild anterior cervical lymphadenopathy. No signs of cellulitis. No gross abscess noted. No trismus. Sublingual tenderness. Eyes: Right eye exhibits no discharge. Left eye exhibits no discharge. No scleral icterus.  Neck: Normal range of motion.  Pulmonary/Chest: Effort normal. No respiratory distress.  Musculoskeletal: Normal range of motion.  Neurological: She is alert and oriented to person, place, and time.  Skin: Skin is warm and dry. She is not diaphoretic.  Psychiatric: She  has a normal mood and affect.  Nursing note and vitals reviewed.  ED Course  Procedures (including critical care time) Labs Review Labs Reviewed  BASIC METABOLIC PANEL  CBC WITH DIFFERENTIAL/PLATELET  POC URINE PREG, ED    Imaging Review No results found.   EKG Interpretation None      MDM   Final diagnoses:  Pain, dental    Patient with toothache.  No gross abscess.  Exam sublingual tenderness on the right side, concern for Ludwig's angina  or spread of infection with diffuse tenderness.  No obvious drainable abscess noted at this time.  PT transferred to [facility] for possible CT scan, Ladona Mow, PA-C to resume care.      Danelle Berry, PA-C 03/26/15 2956  Blake Divine, MD 03/26/15 [phone]

## 2015-03-21 NOTE — ED Notes (Signed)
Pt reports dental pain started on Monday. Pt is unsure if the tooth is broken or has a cavity.

## 2015-03-21 NOTE — ED Provider Notes (Signed)
  Physical Exam  BP 106/73 mmHg  Pulse 64  Temp(Src) 98.4 F (36.9 C) (Oral)  Resp 18  SpO2 99%  LMP 03/01/2015  Physical Exam  Constitutional: She is oriented to person, place, and time. She appears well-developed and well-nourished. No distress.  HENT:  Head: Normocephalic and atraumatic.  Patient does have multiple dental caries, no obvious abscess noted. Patient is mild anterior cervical lymphadenopathy. No signs of cellulitis. No gross abscess noted. No trismus.  Eyes: Right eye exhibits no discharge. Left eye exhibits no discharge. No scleral icterus.  Neck: Normal range of motion.  Pulmonary/Chest: Effort normal. No respiratory distress.  Musculoskeletal: Normal range of motion.  Neurological: She is alert and oriented to person, place, and time.  Skin: Skin is warm and dry. She is not diaphoretic.  Psychiatric: She has a normal mood and affect.  Nursing note and vitals reviewed.   ED Course  Procedures  MDM Pt sent to Pod E from [facility] for r/o of Ludwig's.  D/c home with clinda if CT normal.    CT maxillofacial with contrast with impression: Multiple dental caries.  Mild increased periapical lucency although no findings to suggest periapical abscess are seen.  Dental block performed for anesthesia. Please see procedure note from [facility], PA-C.   Patient with toothache.  Patient afebrile, hemodynamically stable, well-appearing and in no acute distress. No gross abscess.  Exam unconcerning for Ludwig's angina or spread of infection.  Will treat with penicillin and pain medicine.  Urged patient to follow-up with dentist.  Discussed return precautions with patient, patient verbalizes understanding and agreement of this plan.   BP 106/73 mmHg  Pulse 64  Temp(Src) 98.4 F (36.9 C) (Oral)  Resp 18  SpO2 99%  LMP 03/01/2015  Signed,  Ladona Mow, PA-C 12:02 PM     Ladona Mow, PA-C 03/21/15 1202  Gwyneth Sprout, MD 03/22/15 [phone]

## 2015-03-21 NOTE — Discharge Instructions (Signed)
Dental Pain °A tooth ache may be caused by cavities (tooth decay). Cavities expose the nerve of the tooth to air and hot or cold temperatures. It may come from an infection or abscess (also called a boil or furuncle) around your tooth. It is also often caused by dental caries (tooth decay). This causes the pain you are having. °DIAGNOSIS  °Your caregiver can diagnose this problem by exam. °TREATMENT  °· If caused by an infection, it may be treated with medications which kill germs (antibiotics) and pain medications as prescribed by your caregiver. Take medications as directed. °· Only take over-the-counter or prescription medicines for pain, discomfort, or fever as directed by your caregiver. °· Whether the tooth ache today is caused by infection or dental disease, you should see your dentist as soon as possible for further care. °SEEK MEDICAL CARE IF: °The exam and treatment you received today has been provided on an emergency basis only. This is not a substitute for complete medical or dental care. If your problem worsens or new problems (symptoms) appear, and you are unable to meet with your dentist, call or return to this location. °SEEK IMMEDIATE MEDICAL CARE IF:  °· You have a fever. °· You develop redness and swelling of your face, jaw, or neck. °· You are unable to open your mouth. °· You have severe pain uncontrolled by pain medicine. °MAKE SURE YOU:  °· Understand these instructions. °· Will watch your condition. °· Will get help right away if you are not doing well or get worse. °Document Released: 09/26/2005 Document Revised: 12/19/2011 Document Reviewed: 05/14/2008 °ExitCare® Patient Information ©2015 [employer]. This information is not intended to replace advice given to you by your health care provider. Make sure you discuss any questions you have with your health care provider. ° °Emergency Department Resource Guide °1) Find a Doctor and Pay Out of Pocket °Although you won't have to find out who  is covered by your insurance plan, it is a good idea to ask around and get recommendations. You will then need to call the office and see if the doctor you have chosen will accept you as a new patient and what types of options they offer for patients who are self-pay. Some doctors offer discounts or will set up payment plans for their patients who do not have insurance, but you will need to ask so you aren't surprised when you get to your appointment. ° °2) Contact Your Local Health Department °Not all health departments have doctors that can see patients for sick visits, but many do, so it is worth a call to see if yours does. If you don't know where your local health department is, you can check in your phone book. The CDC also has a tool to help you locate your state's health department, and many state websites also have listings of all of their local health departments. ° °3) Find a Walk-in Clinic °If your illness is not likely to be very severe or complicated, you may want to try a walk in clinic. These are popping up all over the country in pharmacies, drugstores, and shopping centers. They're usually staffed by nurse practitioners or physician assistants that have been trained to treat common illnesses and complaints. They're usually fairly quick and inexpensive. However, if you have serious medical issues or chronic medical problems, these are probably not your best option. ° °No Primary Care Doctor: °- Call [facility] at  832-8000 - they can help you locate a primary   care doctor that  accepts your insurance, provides certain services, etc. °- Physician Referral Service- 1-800-533-3463 ° °Chronic Pain Problems: °Organization         Address  Phone   Notes  °[facility]  (336) 297-2271 Patients need to be referred by their primary care doctor.  ° °Medication Assistance: °Organization         Address  Phone   Notes  °[place] [facility] [address]  [address] °[place], [place] [address] (336) 641-8030 --Must be a resident of [place] °-- Must have NO insurance coverage whatsoever (no Medicaid/ Medicare, etc.) °-- The pt. MUST have a primary care doctor that directs their care regularly and follows them in the community °  °[facility]  (866) 331-1348   °[employer]  (888) 892-1162   ° °Agencies that provide inexpensive medical care: °Organization         Address  Phone   Notes  °[facility]  (336) 832-8035   °[facility]    (336) 832-7272   °[facility] [address] °[place], [place] [address] (336) 832-4777   °[facility] [address], °[place] (336) 271-4999   °[facility]    (336) 373-0678   °[facility]    (336) 272-1050   °[facility] ° [address], [place] Phone:  (336) 832-4444, Fax:  (336) 832-4440 Hours of Operation:  9 am - 6 pm, M-F.  Also accepts Medicaid/Medicare and self-pay.  °[facility] ° [address], [place] Phone: (336) 832-3150, Fax: (336) [phone]. Hours of Operation:  8:30 am - 5:30 pm, M-F.  Also accepts Medicaid and self-pay.  °[facility] [place] [address], [place] Phone: (336) 878-6027   °[facility] [address], [place], [place] (336)723-1848, Ext. 123 Mondays & Thursdays: 7-9 AM.  First 15 patients are seen on a first come, first serve basis. °  ° °Medicaid-accepting [place] Providers: ° °Organization         Address  Phone   Notes  °[facility] [address], [place] (336) 641-2100 Also accepts self-pay patients.  °[facility] [address], [place] ° (336) 856-9996   °[facility] [address], [place] (336) 288-8857   °[facility] [address] [place] [address], [place] (336) 299-7000   °Veita Bland [address], [place]  ° (336) 373-1557 Only accepts [place] Access Medicaid patients after they have their name applied to their card.  ° °Self-Pay (no insurance) in [place]: ° °Organization         Address  Phone   Notes  °Sickle Cell Patients, [facility] [address], [place] (336) 832-1970   °[facility] [address], [place] (336) 832-4400   °[facility] ° [address], [place] (336) 992-4800   °[facility]/Dr. Osei-Bonsu ° [address] [place] [address], [place] or [address], [place] (336) 841-8500 Phone number for both [place] and [place] locations is the same.  °[facility] [address], [place] (336) 299-0000   °[facility] [address] [place] [address],  or [address] (336) 852-7530 °(336) 878-2260   °  [facility] [address], [place] (336) 350-1642, phone; (336) 294-5005, fax Sees patients 1st and 3rd Saturday of every month.  Must not qualify for public or private insurance (i.e. Medicaid, Medicare, [place] Health Choice, Veterans' Benefits) • Household income should be no more than 200% of the poverty level •The clinic cannot treat you if you are pregnant or think you are pregnant • Sexually transmitted diseases are not treated at the clinic.  ° ° °Dental Care: °Organization         Address  Phone  Notes  °[facility] [address], [place] (336) 641-6152 Accepts children up to age 21 who are enrolled in Medicaid or [place] Health Choice; pregnant women with a Medicaid card; and children who have applied for Medicaid or [place] Health Choice, but were declined, whose parents can pay a reduced fee at time of service.  °[facility] [place]  [address], [place] (336) 641-7733 Accepts children up to age 21 who are  enrolled in Medicaid or [place] Health Choice; pregnant women with a Medicaid card; and children who have applied for Medicaid or [place] Health Choice, but were declined, whose parents can pay a reduced fee at time of service.  °[facility] ° [address], [place] (336) 641-4533 Patients are seen by appointment only. Walk-ins are not accepted. [facility] will see patients 18 years of age and older. °Monday - Tuesday (8am-5pm) °Most Wednesdays (8:30-5pm) °$30 per visit, cash only  °[facility] ° [address], [place] (336) 641-4533 Patients are seen by appointment only. Walk-ins are not accepted. [facility] will see patients 18 years of age and older. °One Wednesday Evening (Monthly: Volunteer Based).  $30 per visit, cash only  °[facility]  (919) 537-3737 for adults; Children under age 4, call Graduate Pediatric Dentistry at (919) [phone]. Children aged 4-14, please call (919) [phone] to request a pediatric application. ° Dental services are provided in all areas of dental care including fillings, crowns and bridges, complete and partial dentures, implants, gum treatment, root canals, and extractions. Preventive care is also provided. Treatment is provided to both adults and children. °Patients are selected via a lottery and there is often a waiting list. °  °[facility] [address], °[place] ° (336) 763-8833 [email or web address] °  °[facility] [address], [place], [place] (336)723-1848, Ext. 123 Second and Fourth Thursday of each month, opens at 6:30 AM; Clinic ends at 9 AM.  Patients are seen on a first-come first-served basis, and a limited number are seen during each clinic.  ° °[facility] ° [address], [place], [place] (336) 723-7904   Eligibility Requirements °You must have lived in [place], [place], or [place] counties for at least the last three months. °  You  cannot be eligible for state or federal sponsored healthcare insurance, including Veterans Administration, Medicaid, or Medicare. °  You generally cannot be eligible for healthcare insurance through your employer.  °  How to apply: °Eligibility screenings are held every Tuesday and Wednesday afternoon from 1:00 pm until 4:00 pm. You do not need an appointment for the interview!  °[facility] [address], [place],  336-631-2330   °[facility] [facility]  336-342-8273   °[facility]  336-703-3100   °[facility]  336-570-6415   ° °Behavioral Health Resources in the Community: °Intensive Outpatient Programs °Organization         Address  Phone  Notes  °[facility] [address], [place], [place] 336-878-6098   °[facility] [address], [place], [place] 336-832-9800   °[facility] [address], [place], [place] ° 336-882-2125   °[facility] [address],  °[place], [place] 1-800-853-5163 or 336-641-4981   °Substance Abuse Resources °Organization         Address  Phone  Notes  °[facility]  336-882-2125   °[facility]  336-784-9470   °[facility]  336-285-9073   °[facility]  336-845-3988   °Residential & Outpatient Substance Abuse Program  1-800-659-3381   °Psychological Services °Organization         Address  Phone  Notes  °[facility]  336- 832-9600   °[facility]  336- 378-7881   °[facility] [address], [place] 1-800-853-5163 or 336-641-4981   ° °Mobile Crisis Teams °Organization         Address  Phone  Notes  °[facility], Mobile Crisis Care Unit  1-877-626-1772   °Assertive °[facility] ° [address] [place], [place] 336-834-9664   °Sharon DeEsch [address] °[place] 336-554-5454   ° °Self-Help/Support  Groups °Organization         Address  Phone             Notes  °[facility] - variety of support groups  336- 373-1402 Call for more information  °Narcotics Anonymous (NA), [facility] [address], °[place]  2 meetings at this location  ° °Residential Treatment Programs °Organization         Address  Phone  Notes  °[facility] [address],    °[place]  1-866-801-8205   °[facility] ° [address], [place], [place] 704-293-8524   °[facility] [address], [place] 336-845-3988 Admissions: 8am-3pm M-F  °[facility] [address]    °[place], [place] 336-841-1104   °[facility] [address], [place], [place] 336-379-7146   °[facility] [address]  °[place], [place] 336-285-9073   °[facility] Intensive Outpatient [address], [place], [place] 336-852-3033   °[facility] [address]  °[place], [place] 1-877-615-2722 or 336-784-9470   °[facility] [address] [place], [place] 336-227-7417 Accepts Medicaid  °[facility] [address]  °[place] 1-800-659-3381 Substance Abuse/Addiction Treatment  ° °[place] Behavioral Health Resources °Organization         Address  Phone  Notes  °[facility]  (888) 581-9988   °Julie Brannon, PhD [address] [place], [place]   (336) 349-5553 or (336) 951-0000   °[facility]   [address] °[place], [place] (336) 349-4454   °[facility] [address], [place], [place] (336) 342-8316 Insurance/Medicaid/sponsorship through [facility]  °[facility] [address]                                    [place], [place] (336) 342-8316 Therapy/tele-psych/case  °[facility]   [address]  ° [place], [place] (336) 349-2233    °Dr. Arfeen  (336) 349-4544   °[facility] [place]  [facility]  [employer] [place] [facility] 1) [address], [place] °2) [address], [place] °3)  [address], [place] (336) 349-3220 °(336) 342-7768 ° °(336) 342-8140   °[place] [facility] (336) 342-1394 or (336) 342-3537 (After Hours)    ° ° ° °

## 2015-03-23 ENCOUNTER — Encounter: Payer: Self-pay | Admitting: Internal Medicine

## 2015-05-15 DIAGNOSIS — Z113 Encounter for screening for infections with a predominantly sexual mode of transmission: Secondary | ICD-10-CM

## 2015-05-15 DIAGNOSIS — B20 Human immunodeficiency virus [HIV] disease: Secondary | ICD-10-CM

## 2015-05-15 DIAGNOSIS — Z79899 Other long term (current) drug therapy: Secondary | ICD-10-CM

## 2015-05-19 ENCOUNTER — Ambulatory Visit: Payer: Self-pay

## 2015-05-19 ENCOUNTER — Other Ambulatory Visit (INDEPENDENT_AMBULATORY_CARE_PROVIDER_SITE_OTHER): Payer: Self-pay

## 2015-05-19 DIAGNOSIS — B20 Human immunodeficiency virus [HIV] disease: Secondary | ICD-10-CM

## 2015-05-19 LAB — CBC WITH DIFFERENTIAL/PLATELET
Basophils Absolute: 0 10*3/uL (ref 0.0–0.1)
Basophils Relative: 1 % (ref 0–1)
EOS ABS: 0.2 10*3/uL (ref 0.0–0.7)
Eosinophils Relative: 4 % (ref 0–5)
HCT: 35.9 % — ABNORMAL LOW (ref 36.0–46.0)
Hemoglobin: 11.6 g/dL — ABNORMAL LOW (ref 12.0–15.0)
LYMPHS PCT: 41 % (ref 12–46)
Lymphs Abs: 1.8 10*3/uL (ref 0.7–4.0)
MCH: 28.7 pg (ref 26.0–34.0)
MCHC: 32.3 g/dL (ref 30.0–36.0)
MCV: 88.9 fL (ref 78.0–100.0)
MONO ABS: 0.7 10*3/uL (ref 0.1–1.0)
MONOS PCT: 17 % — AB (ref 3–12)
MPV: 9.6 fL (ref 8.6–12.4)
NEUTROS ABS: 1.6 10*3/uL — AB (ref 1.7–7.7)
Neutrophils Relative %: 37 % — ABNORMAL LOW (ref 43–77)
PLATELETS: 344 10*3/uL (ref 150–400)
RBC: 4.04 MIL/uL (ref 3.87–5.11)
RDW: 14 % (ref 11.5–15.5)
WBC: 4.4 10*3/uL (ref 4.0–10.5)

## 2015-05-20 ENCOUNTER — Ambulatory Visit: Payer: Self-pay

## 2015-05-20 ENCOUNTER — Other Ambulatory Visit: Payer: Self-pay

## 2015-05-20 LAB — LIPID PANEL
CHOLESTEROL: 149 mg/dL (ref 125–200)
HDL: 52 mg/dL (ref >=46)
LDL Cholesterol: 85 mg/dL (ref <130)
Total CHOL/HDL Ratio: 2.9 Ratio (ref <=5.0)
Triglycerides: 59 mg/dL (ref <150)
VLDL: 12 mg/dL (ref <30)

## 2015-05-20 LAB — COMPREHENSIVE METABOLIC PANEL
ALT: 16 U/L (ref 6–29)
AST: 21 U/L (ref 10–30)
Albumin: 3.5 g/dL — ABNORMAL LOW (ref 3.6–5.1)
Alkaline Phosphatase: 68 U/L (ref 33–115)
BILIRUBIN TOTAL: 0.3 mg/dL (ref 0.2–1.2)
BUN: 9 mg/dL (ref 7–25)
CHLORIDE: 109 mmol/L (ref 98–110)
CO2: 22 mmol/L (ref 20–31)
Calcium: 8.8 mg/dL (ref 8.6–10.2)
Creat: 0.87 mg/dL (ref 0.50–1.10)
Glucose, Bld: 89 mg/dL (ref 65–99)
Potassium: 4.2 mmol/L (ref 3.5–5.3)
SODIUM: 140 mmol/L (ref 135–146)
TOTAL PROTEIN: 6.5 g/dL (ref 6.1–8.1)

## 2015-05-20 LAB — RPR

## 2015-05-21 ENCOUNTER — Encounter: Payer: Self-pay | Admitting: *Deleted

## 2015-05-21 LAB — HIV-1 RNA QUANT-NO REFLEX-BLD
HIV 1 RNA Quant: <20 copies/mL (ref <20)
HIV-1 RNA Quant, Log: <1.3 {Log} (ref <1.30)

## 2015-05-21 LAB — T-HELPER CELL (CD4) - (RCID CLINIC ONLY)
CD4 T CELL HELPER: 27 % — AB (ref 33–55)
CD4 T Cell Abs: 490 /uL (ref 400–2700)

## 2015-05-22 ENCOUNTER — Ambulatory Visit: Payer: Self-pay

## 2015-05-22 ENCOUNTER — Telehealth: Payer: Self-pay | Admitting: *Deleted

## 2015-05-22 NOTE — Telephone Encounter (Signed)
Left message requesting pt call for a new PAP smear appt.

## 2015-05-26 ENCOUNTER — Other Ambulatory Visit: Payer: Self-pay

## 2015-06-09 ENCOUNTER — Ambulatory Visit: Payer: Self-pay | Admitting: Internal Medicine

## 2015-06-29 NOTE — Progress Notes (Signed)
Faxed approval notice to [facility]. Howell, Michelle M, RN 

## 2015-08-27 ENCOUNTER — Ambulatory Visit: Payer: Self-pay | Admitting: Internal Medicine

## 2015-09-09 ENCOUNTER — Telehealth: Payer: Self-pay | Admitting: *Deleted

## 2015-09-09 NOTE — Telephone Encounter (Signed)
Needing lab and MD appts scheuled.

## 2015-10-20 ENCOUNTER — Other Ambulatory Visit: Payer: Self-pay | Admitting: Internal Medicine

## 2015-12-08 ENCOUNTER — Ambulatory Visit (INDEPENDENT_AMBULATORY_CARE_PROVIDER_SITE_OTHER): Payer: Self-pay | Admitting: Internal Medicine

## 2015-12-08 VITALS — BP 123/77 | HR 93 | Temp 98.8°F | Wt 153.0 lb

## 2015-12-08 DIAGNOSIS — Z113 Encounter for screening for infections with a predominantly sexual mode of transmission: Secondary | ICD-10-CM

## 2015-12-08 DIAGNOSIS — IMO0002 Reserved for concepts with insufficient information to code with codable children: Secondary | ICD-10-CM

## 2015-12-08 DIAGNOSIS — B20 Human immunodeficiency virus [HIV] disease: Secondary | ICD-10-CM

## 2015-12-08 DIAGNOSIS — Z0000 Encounter for general adult medical examination without abnormal findings: Secondary | ICD-10-CM

## 2015-12-08 DIAGNOSIS — Z79899 Other long term (current) drug therapy: Secondary | ICD-10-CM

## 2015-12-08 DIAGNOSIS — R896 Abnormal cytological findings in specimens from other organs, systems and tissues: Secondary | ICD-10-CM

## 2015-12-08 LAB — CBC WITH DIFFERENTIAL/PLATELET
Basophils Absolute: 0 10*3/uL (ref 0.0–0.1)
Basophils Relative: 0 % (ref 0–1)
Eosinophils Absolute: 0.1 10*3/uL (ref 0.0–0.7)
Eosinophils Relative: 1 % (ref 0–5)
HEMATOCRIT: 36.3 % (ref 36.0–46.0)
Hemoglobin: 11.8 g/dL — ABNORMAL LOW (ref 12.0–15.0)
LYMPHS ABS: 2.1 10*3/uL (ref 0.7–4.0)
LYMPHS PCT: 32 % (ref 12–46)
MCH: 28.9 pg (ref 26.0–34.0)
MCHC: 32.5 g/dL (ref 30.0–36.0)
MCV: 89 fL (ref 78.0–100.0)
MPV: 9.7 fL (ref 8.6–12.4)
Monocytes Absolute: 0.5 10*3/uL (ref 0.1–1.0)
Monocytes Relative: 8 % (ref 3–12)
NEUTROS PCT: 59 % (ref 43–77)
Neutro Abs: 4 10*3/uL (ref 1.7–7.7)
Platelets: 313 10*3/uL (ref 150–400)
RBC: 4.08 MIL/uL (ref 3.87–5.11)
RDW: 14.1 % (ref 11.5–15.5)
WBC: 6.7 10*3/uL (ref 4.0–10.5)

## 2015-12-08 LAB — COMPLETE METABOLIC PANEL WITH GFR
ALT: 17 U/L (ref 6–29)
AST: 22 U/L (ref 10–30)
Albumin: 4.1 g/dL (ref 3.6–5.1)
Alkaline Phosphatase: 85 U/L (ref 33–115)
BILIRUBIN TOTAL: 0.6 mg/dL (ref 0.2–1.2)
BUN: 9 mg/dL (ref 7–25)
CHLORIDE: 107 mmol/L (ref 98–110)
CO2: 21 mmol/L (ref 20–31)
CREATININE: 0.88 mg/dL (ref 0.50–1.10)
Calcium: 9.3 mg/dL (ref 8.6–10.2)
GFR, Est Non African American: 85 mL/min (ref >=60)
Glucose, Bld: 83 mg/dL (ref 65–99)
Potassium: 4.5 mmol/L (ref 3.5–5.3)
Sodium: 138 mmol/L (ref 135–146)
TOTAL PROTEIN: 7.2 g/dL (ref 6.1–8.1)

## 2015-12-08 LAB — LIPID PANEL
Cholesterol: 160 mg/dL (ref 125–200)
HDL: 64 mg/dL (ref >=46)
LDL CALC: 76 mg/dL (ref <130)
TRIGLYCERIDES: 102 mg/dL (ref <150)
Total CHOL/HDL Ratio: 2.5 Ratio (ref <=5.0)
VLDL: 20 mg/dL (ref <30)

## 2015-12-08 MED ORDER — ELVITEG-COBIC-EMTRICIT-TENOFAF 150-150-200-10 MG PO TABS: 1.0000 | ORAL_TABLET | Freq: Every day | ORAL | Status: DC

## 2015-12-08 NOTE — Progress Notes (Signed)
Patient ID: Colleen Walls, female   DOB: [birth year], 35 y.o.   MRN: [id number]      RFV: 10 month follow up for hiv disease  Patient ID: Colleen Walls, female   DOB: 08-23-1981, 35 y.o.   MRN: [id number]  HPI 35yo F with hiv disease, CD 4 count of 490/VL<20 on stribild. She reports doing well with adherence. Has been caring for her mother in [place] so has been switching back and forth between [place] and [place]. She states that she maybe starting new job.   Outpatient Encounter Prescriptions as of 12/08/2015  Medication Sig  . acetaminophen (TYLENOL) 500 MG tablet Take 1 tablet (500 mg total) by mouth every 6 (six) hours as needed.  Marland Kitchen albuterol (PROVENTIL HFA;VENTOLIN HFA) 108 (90 BASE) MCG/ACT inhaler Inhale 2 puffs into the lungs every 6 (six) hours as needed. For shortness of breath or wheeze  . clindamycin (CLEOCIN) 150 MG capsule Take 3 capsules (450 mg total) by mouth 3 (three) times daily.  Marland Kitchen elvitegravir-cobicistat-emtricitabine-tenofovir (STRIBILD) 150-150-200-300 MG TABS tablet TAKE 1 TABLET BY MOUTH EVERY DAY WITH BREAKFAST  . HYDROcodone-acetaminophen (HYCET) 7.5-325 mg/15 ml solution Take 10 mLs by mouth 4 (four) times daily as needed for moderate pain or severe pain. (Patient not taking: Reported on 02/12/2015)  . levonorgestrel-ethinyl estradiol (NORDETTE) 0.15-30 MG-MCG tablet Take 1 tablet by mouth daily.  . promethazine (PHENERGAN) 25 MG tablet TAKE 1 TABLET BY MOUTH EVERY 6 HOURS AS NEEDED FOR NAUSEA (Patient not taking: Reported on 02/12/2015)  . STRIBILD 150-150-200-300 MG TABS tablet TAKE 1 TABLET BY MOUTH EVERY MORNING WITH BREAKFAST   No facility-administered encounter medications on file as of 12/08/2015.     Patient Active Problem List   Diagnosis Date Noted  . ASCUS with positive high risk HPV 09/17/2012  . HIV (human immunodeficiency virus infection) (HCC) 06/12/2012  . Boil 06/12/2012  . Asthma 06/12/2012     Health Maintenance Due  Topic Date Due  . INFLUENZA VACCINE   05/11/2015     Review of Systems Review of Systems  Constitutional: Negative for fever, chills, diaphoresis, activity change, appetite change, fatigue and some minor + weight change.  HENT: Negative for congestion, sore throat, rhinorrhea, sneezing, trouble swallowing and sinus pressure.  Eyes: Negative for photophobia and visual disturbance.  Respiratory: Negative for cough, chest tightness, shortness of breath, wheezing and stridor.  Cardiovascular: Negative for chest pain, palpitations and leg swelling.  Gastrointestinal: Negative for nausea, vomiting, abdominal pain, diarrhea, constipation, blood in stool, abdominal distention and anal bleeding.  Genitourinary: Negative for dysuria, hematuria, flank pain and difficulty urinating.  Musculoskeletal: Negative for myalgias, back pain, joint swelling, arthralgias and gait problem.  Skin: Negative for color change, pallor, rash and wound.  Neurological: Negative for dizziness, tremors, weakness and light-headedness.  Hematological: Negative for adenopathy. Does not bruise/bleed easily.  Psychiatric/Behavioral: Negative for behavioral problems, confusion, sleep disturbance, dysphoric mood, decreased concentration and agitation.    Physical Exam   BP 123/77 mmHg  Pulse 93  Temp(Src) 98.8 F (37.1 C) (Oral)  Wt 153 lb (69.4 kg) Physical Exam  Constitutional:  oriented to person, place, and time. appears well-developed and well-nourished. No distress.  HENT: [place]/AT, PERRLA, no scleral icterus Mouth/Throat: Oropharynx is clear and moist. No oropharyngeal exudate.  Cardiovascular: Normal rate, regular rhythm and normal heart sounds. Exam reveals no gallop and no friction rub.  No murmur heard.  Pulmonary/Chest: Effort normal and breath sounds normal. No respiratory distress.  has no wheezes.  Neck = supple,  no nuchal rigidity Lymphadenopathy: no cervical adenopathy. No axillary adenopathy Neurological: alert and oriented to person, place,  and time.  Skin: Skin is warm and dry. No rash noted. No erythema.  Psychiatric: a normal mood and affect.  behavior is normal.    Lab Results  Component Value Date   CD4TCELL 27* 05/19/2015   Lab Results  Component Value Date   CD4TABS 490 05/19/2015   CD4TABS 530 08/20/2014   CD4TABS 350* 04/24/2014   Lab Results  Component Value Date   HIV1RNAQUANT <20 05/19/2015   Lab Results  Component Value Date   HEPBSAB POS* 06/12/2012   No results found for: RPR  CBC Lab Results  Component Value Date   WBC 4.4 05/19/2015   RBC 4.04 05/19/2015   HGB 11.6* 05/19/2015   HCT 35.9* 05/19/2015   PLT 344 05/19/2015   MCV 88.9 05/19/2015   MCH 28.7 05/19/2015   MCHC 32.3 05/19/2015   RDW 14.0 05/19/2015   LYMPHSABS 1.8 05/19/2015   MONOABS 0.7 05/19/2015   EOSABS 0.2 05/19/2015   BASOSABS 0.0 05/19/2015   BMET Lab Results  Component Value Date   NA 140 05/19/2015   K 4.2 05/19/2015   CL 109 05/19/2015   CO2 22 05/19/2015   GLUCOSE 89 05/19/2015   BUN 9 05/19/2015   CREATININE 0.87 05/19/2015   CALCIUM 8.8 05/19/2015   GFRNONAA >60 03/21/2015   GFRAA >60 03/21/2015     Assessment and Plan  hiv disease = will check labs and plan to switch to genvoya to TAF based regimen  Health maintenance = will check for rpr, lipid profile. Give flu vaccine  Hx of ascus = pap is due in < 3 months.

## 2015-12-09 LAB — T-HELPER CELL (CD4) - (RCID CLINIC ONLY)
CD4 % Helper T Cell: 24 % — ABNORMAL LOW (ref 33–55)
CD4 T Cell Abs: 540 /uL (ref 400–2700)

## 2015-12-09 LAB — RPR

## 2015-12-09 LAB — HIV-1 RNA QUANT-NO REFLEX-BLD
HIV 1 RNA Quant: <20 copies/mL (ref <20)
HIV-1 RNA Quant, Log: <1.3 Log copies/mL (ref <1.30)

## 2016-02-22 ENCOUNTER — Ambulatory Visit (INDEPENDENT_AMBULATORY_CARE_PROVIDER_SITE_OTHER): Payer: Self-pay | Admitting: Internal Medicine

## 2016-02-22 ENCOUNTER — Encounter: Payer: Self-pay | Admitting: Internal Medicine

## 2016-02-22 VITALS — BP 121/72 | HR 78 | Temp 98.2°F | Ht 63.0 in | Wt 153.0 lb

## 2016-02-22 DIAGNOSIS — J069 Acute upper respiratory infection, unspecified: Secondary | ICD-10-CM

## 2016-02-22 DIAGNOSIS — B20 Human immunodeficiency virus [HIV] disease: Secondary | ICD-10-CM

## 2016-02-22 NOTE — Progress Notes (Signed)
Patient ID: Colleen Walls, female   DOB: 1981-05-01, 35 y.o.   MRN: [id number]       Patient ID: Colleen Walls, female   DOB: 06-29-1981, 35 y.o.   MRN: [id number]  HPI Colleen Walls is a 35yo F with HIV disease, CD 4 count of 540/VL<20 (feb 2017) currently on genvoya. 1 day of sore throat. Sick contact, with infant in the household. She is otherwise doing ok, no other recent illnesses  Outpatient Encounter Prescriptions as of 02/22/2016  Medication Sig  . acetaminophen (TYLENOL) 500 MG tablet Take 1 tablet (500 mg total) by mouth every 6 (six) hours as needed.  Marland Kitchen albuterol (PROVENTIL HFA;VENTOLIN HFA) 108 (90 BASE) MCG/ACT inhaler Inhale 2 puffs into the lungs every 6 (six) hours as needed. For shortness of breath or wheeze  . elvitegravir-cobicistat-emtricitabine-tenofovir (GENVOYA) 150-150-200-10 MG TABS tablet Take 1 tablet by mouth daily with breakfast.  . levonorgestrel-ethinyl estradiol (NORDETTE) 0.15-30 MG-MCG tablet Take 1 tablet by mouth daily.  . promethazine (PHENERGAN) 25 MG tablet TAKE 1 TABLET BY MOUTH EVERY 6 HOURS AS NEEDED FOR NAUSEA   No facility-administered encounter medications on file as of 02/22/2016.     Patient Active Problem List   Diagnosis Date Noted  . ASCUS with positive high risk HPV 09/17/2012  . HIV (human immunodeficiency virus infection) (HCC) 06/12/2012  . Boil 06/12/2012  . Asthma 06/12/2012     There are no preventive care reminders to display for this patient.   Review of Systems + sore throat. No fever, chills, nightsweat, nasal congestion. 10 point ros is otherwise negative Physical Exam   BP 121/72 mmHg  Pulse 78  Temp(Src) 98.2 F (36.8 C) (Oral)  Ht  (1.6 m)  Wt 153 lb (69.4 kg)  BMI 27.11 kg/m2 Physical Exam  Constitutional:  oriented to person, place, and time. appears well-developed and well-nourished. No distress.  HENT: [place]/AT, PERRLA, no scleral icterus Mouth/Throat: Oropharynx is clear and moist. No oropharyngeal exudate.    Cardiovascular: Normal rate, regular rhythm and normal heart sounds. Exam reveals no gallop and no friction rub.  No murmur heard.  Pulmonary/Chest: Effort normal and breath sounds normal. No respiratory distress.  has no wheezes.  Neck = supple, no nuchal rigidity Abdominal: Soft. Bowel sounds are normal.  exhibits no distension. There is no tenderness.  Lymphadenopathy: no cervical adenopathy. No axillary adenopathy Neurological: alert and oriented to person, place, and time.  Skin: Skin is warm and dry. No rash noted. No erythema.  Psychiatric: a normal mood and affect.  behavior is normal.   Lab Results  Component Value Date   CD4TCELL 24* 12/08/2015   Lab Results  Component Value Date   CD4TABS 540 12/08/2015   CD4TABS 490 05/19/2015   CD4TABS 530 08/20/2014   Lab Results  Component Value Date   HIV1RNAQUANT <20 12/08/2015   Lab Results  Component Value Date   HEPBSAB POS* 06/12/2012   No results found for: RPR  CBC Lab Results  Component Value Date   WBC 6.7 12/08/2015   RBC 4.08 12/08/2015   HGB 11.8* 12/08/2015   HCT 36.3 12/08/2015   PLT 313 12/08/2015   MCV 89.0 12/08/2015   MCH 28.9 12/08/2015   MCHC 32.5 12/08/2015   RDW 14.1 12/08/2015   LYMPHSABS 2.1 12/08/2015   MONOABS 0.5 12/08/2015   EOSABS 0.1 12/08/2015   BASOSABS 0.0 12/08/2015   BMET Lab Results  Component Value Date   NA 138 12/08/2015   K 4.5 12/08/2015  CL 107 12/08/2015   CO2 21 12/08/2015   GLUCOSE 83 12/08/2015   BUN 9 12/08/2015   CREATININE 0.88 12/08/2015   CALCIUM 9.3 12/08/2015   GFRNONAA 85 12/08/2015   GFRAA >89 12/08/2015     Assessment and Plan  hiv disease =  Well controlled. Continue on genvoya  Health maintenance = will need to do pap in the coming month  Uri = likely viral, recommended supportive care, can do ibuprofen to help with inflammation, fluids

## 2016-02-26 ENCOUNTER — Ambulatory Visit (INDEPENDENT_AMBULATORY_CARE_PROVIDER_SITE_OTHER): Payer: Self-pay | Admitting: *Deleted

## 2016-02-26 DIAGNOSIS — Z113 Encounter for screening for infections with a predominantly sexual mode of transmission: Secondary | ICD-10-CM

## 2016-02-26 DIAGNOSIS — Z124 Encounter for screening for malignant neoplasm of cervix: Secondary | ICD-10-CM

## 2016-02-26 NOTE — Progress Notes (Signed)
  Subjective:     Colleen Walls is a 35 y.o. woman who comes in today for a  pap smear only.  Previous abnormal Pap smears: yes. Contraception: birth control pills  Objective:    There were no vitals taken for this visit. Pelvic Exam:  Pap smear obtained.   Assessment:    Screening pap smear.   Plan:    Follow up in one year, or as indicated by Pap results.  Pt given educational materials re: HIV and women, self-esteem, BSE, nutrition and diet management, PAP smears and partner safety. Pt given condoms

## 2016-02-26 NOTE — Patient Instructions (Signed)
Your results will be ready in about a week.  I will send them to you in MyChart.  Thank you for coming to the Center for your care.  [name], RN 

## 2016-02-29 LAB — CERVICOVAGINAL ANCILLARY ONLY
CHLAMYDIA, DNA PROBE: NEGATIVE
NEISSERIA GONORRHEA: NEGATIVE

## 2016-03-01 LAB — CYTOLOGY - PAP

## 2016-03-03 ENCOUNTER — Encounter: Payer: Self-pay | Admitting: *Deleted

## 2016-03-17 ENCOUNTER — Encounter: Payer: Self-pay | Admitting: *Deleted

## 2016-07-14 ENCOUNTER — Ambulatory Visit: Payer: Self-pay

## 2016-07-14 ENCOUNTER — Other Ambulatory Visit: Payer: Self-pay

## 2016-07-14 DIAGNOSIS — B20 Human immunodeficiency virus [HIV] disease: Secondary | ICD-10-CM

## 2016-07-14 LAB — CBC WITH DIFFERENTIAL/PLATELET
Basophils Absolute: 43 cells/uL (ref 0–200)
Basophils Relative: 1 %
EOS PCT: 2 %
Eosinophils Absolute: 86 cells/uL (ref 15–500)
HEMATOCRIT: 35.7 % (ref 35.0–45.0)
HEMOGLOBIN: 11.7 g/dL (ref 11.7–15.5)
Lymphocytes Relative: 37 %
Lymphs Abs: 1591 cells/uL (ref 850–3900)
MCH: 29.1 pg (ref 27.0–33.0)
MCHC: 32.8 g/dL (ref 32.0–36.0)
MCV: 88.8 fL (ref 80.0–100.0)
MONO ABS: 817 {cells}/uL (ref 200–950)
MPV: 9.5 fL (ref 7.5–12.5)
Monocytes Relative: 19 %
NEUTROS ABS: 1763 {cells}/uL (ref 1500–7800)
Neutrophils Relative %: 41 %
Platelets: 309 10*3/uL (ref 140–400)
RBC: 4.02 MIL/uL (ref 3.80–5.10)
RDW: 13.6 % (ref 11.0–15.0)
WBC: 4.3 10*3/uL (ref 3.8–10.8)

## 2016-07-14 LAB — COMPREHENSIVE METABOLIC PANEL
ALK PHOS: 66 U/L (ref 33–115)
ALT: 12 U/L (ref 6–29)
AST: 20 U/L (ref 10–30)
Albumin: 3.9 g/dL (ref 3.6–5.1)
BUN: 11 mg/dL (ref 7–25)
CO2: 21 mmol/L (ref 20–31)
CREATININE: 0.82 mg/dL (ref 0.50–1.10)
Calcium: 8.9 mg/dL (ref 8.6–10.2)
Chloride: 109 mmol/L (ref 98–110)
Glucose, Bld: 83 mg/dL (ref 65–99)
Potassium: 4.5 mmol/L (ref 3.5–5.3)
SODIUM: 138 mmol/L (ref 135–146)
TOTAL PROTEIN: 6.6 g/dL (ref 6.1–8.1)
Total Bilirubin: 0.4 mg/dL (ref 0.2–1.2)

## 2016-07-15 LAB — T-HELPER CELL (CD4) - (RCID CLINIC ONLY)
CD4 % Helper T Cell: 27 % — ABNORMAL LOW (ref 33–55)
CD4 T CELL ABS: 490 /uL (ref 400–2700)

## 2016-07-15 LAB — HIV-1 RNA QUANT-NO REFLEX-BLD: HIV 1 RNA Quant: <20 copies/mL (ref <20)

## 2016-07-28 ENCOUNTER — Ambulatory Visit: Payer: Self-pay | Admitting: Internal Medicine

## 2016-08-23 ENCOUNTER — Ambulatory Visit (INDEPENDENT_AMBULATORY_CARE_PROVIDER_SITE_OTHER): Payer: Self-pay | Admitting: Internal Medicine

## 2016-08-23 ENCOUNTER — Encounter: Payer: Self-pay | Admitting: Internal Medicine

## 2016-08-23 VITALS — BP 153/77 | HR 80 | Temp 97.6°F | Wt 169.0 lb

## 2016-08-23 DIAGNOSIS — B20 Human immunodeficiency virus [HIV] disease: Secondary | ICD-10-CM

## 2016-08-23 DIAGNOSIS — Z23 Encounter for immunization: Secondary | ICD-10-CM

## 2016-08-23 DIAGNOSIS — R030 Elevated blood-pressure reading, without diagnosis of hypertension: Secondary | ICD-10-CM

## 2016-08-23 NOTE — Progress Notes (Signed)
Patient ID: Colleen Walls, female   DOB: March 21, 1981, 35 y.o.   MRN: [id number]  HPI Colleen Walls is a 35yo F iwht HIV disease, CD 4 count of 490/VL<20 on genvoya. Doing well with adherence. No recent illnesses other than recovering from a cold She is uptodate on health screening - had pap in may 2017 which was normal. Outpatient Encounter Prescriptions as of 08/23/2016  Medication Sig  . acetaminophen (TYLENOL) 500 MG tablet Take 1 tablet (500 mg total) by mouth every 6 (six) hours as needed.  Marland Kitchen. albuterol (PROVENTIL HFA;VENTOLIN HFA) 108 (90 BASE) MCG/ACT inhaler Inhale 2 puffs into the lungs every 6 (six) hours as needed. For shortness of breath or wheeze  . elvitegravir-cobicistat-emtricitabine-tenofovir (GENVOYA) 150-150-200-10 MG TABS tablet Take 1 tablet by mouth daily with breakfast.  . levonorgestrel-ethinyl estradiol (NORDETTE) 0.15-30 MG-MCG tablet Take 1 tablet by mouth daily.  . promethazine (PHENERGAN) 25 MG tablet TAKE 1 TABLET BY MOUTH EVERY 6 HOURS AS NEEDED FOR NAUSEA   No facility-administered encounter medications on file as of 08/23/2016.      Patient Active Problem List   Diagnosis Date Noted  . ASCUS with positive high risk HPV 09/17/2012  . HIV (human immunodeficiency virus infection) (HCC) 06/12/2012  . Boil 06/12/2012  . Asthma 06/12/2012     Health Maintenance Due  Topic Date Due  . INFLUENZA VACCINE  05/10/2016     Review of Systems Recovering from a cold, 10 point ros is otherwise negative Physical Exam   BP (!) 153/77   Pulse 80   Temp 97.6 F (36.4 C) (Oral)   Wt 169 lb (76.7 kg)   LMP 08/05/2016   BMI 29.94 kg/m  Physical Exam  Constitutional:  oriented to person, place, and time. appears well-developed and well-nourished. No distress.  HENT: [place]/AT, PERRLA, no scleral icterus Mouth/Throat: Oropharynx is clear and moist. No oropharyngeal exudate.  Cardiovascular: Normal rate, regular rhythm and normal heart sounds. Exam reveals no gallop and  no friction rub.  No murmur heard.  Pulmonary/Chest: Effort normal and breath sounds normal. No respiratory distress.  has no wheezes.  Neck = supple, no nuchal rigidity Abdominal: Soft. Bowel sounds are normal.  exhibits no distension. There is no tenderness.  Lymphadenopathy: no cervical adenopathy. No axillary adenopathy Neurological: alert and oriented to person, place, and time.  Skin: Skin is warm and dry. No rash noted. No erythema.  Psychiatric: a normal mood and affect.  behavior is normal.   Lab Results  Component Value Date   CD4TCELL 27 (L) 07/14/2016   Lab Results  Component Value Date   CD4TABS 490 07/14/2016   CD4TABS 540 12/08/2015   CD4TABS 490 05/19/2015   Lab Results  Component Value Date   HIV1RNAQUANT <20 07/14/2016   Lab Results  Component Value Date   HEPBSAB POS (A) 06/12/2012   No results found for: RPR  CBC Lab Results  Component Value Date   WBC 4.3 07/14/2016   RBC 4.02 07/14/2016   HGB 11.7 07/14/2016   HCT 35.7 07/14/2016   PLT 309 07/14/2016   MCV 88.8 07/14/2016   MCH 29.1 07/14/2016   MCHC 32.8 07/14/2016   RDW 13.6 07/14/2016   LYMPHSABS 1,591 07/14/2016   MONOABS 817 07/14/2016   EOSABS 86 07/14/2016   BASOSABS 43 07/14/2016   BMET Lab Results  Component Value Date   NA 138 07/14/2016   K 4.5 07/14/2016   CL 109 07/14/2016   CO2 21 07/14/2016   GLUCOSE 83  07/14/2016   BUN 11 07/14/2016   CREATININE 0.82 07/14/2016   CALCIUM 8.9 07/14/2016   GFRNONAA 85 12/08/2015   GFRAA >89 12/08/2015     Assessment and Plan  hiv disease = continue with genvoya  Pre-hypertension = will have her repeat BP at next visit. Attempt diet modification, less salt for now.  Health maintenance = uptodate on pap. Plan to get flu vaccine today

## 2016-08-26 ENCOUNTER — Encounter: Payer: Self-pay | Admitting: Internal Medicine

## 2016-09-24 ENCOUNTER — Encounter: Payer: Self-pay | Admitting: Internal Medicine

## 2016-10-01 ENCOUNTER — Emergency Department (HOSPITAL_COMMUNITY)
Admission: EM | Admit: 2016-10-01 | Discharge: 2016-10-01 | Disposition: A | Discharge status: Home / Self Care | Payer: Self-pay | Attending: Emergency Medicine | Admitting: Emergency Medicine

## 2016-10-01 ENCOUNTER — Encounter (HOSPITAL_COMMUNITY): Payer: Self-pay | Admitting: Emergency Medicine

## 2016-10-01 DIAGNOSIS — Y999 Unspecified external cause status: Secondary | ICD-10-CM | POA: Insufficient documentation

## 2016-10-01 DIAGNOSIS — Y929 Unspecified place or not applicable: Secondary | ICD-10-CM | POA: Insufficient documentation

## 2016-10-01 DIAGNOSIS — F17210 Nicotine dependence, cigarettes, uncomplicated: Secondary | ICD-10-CM | POA: Insufficient documentation

## 2016-10-01 DIAGNOSIS — Z79899 Other long term (current) drug therapy: Secondary | ICD-10-CM | POA: Insufficient documentation

## 2016-10-01 DIAGNOSIS — J45909 Unspecified asthma, uncomplicated: Secondary | ICD-10-CM | POA: Insufficient documentation

## 2016-10-01 DIAGNOSIS — X58XXXA Exposure to other specified factors, initial encounter: Secondary | ICD-10-CM | POA: Insufficient documentation

## 2016-10-01 DIAGNOSIS — Y939 Activity, unspecified: Secondary | ICD-10-CM | POA: Insufficient documentation

## 2016-10-01 DIAGNOSIS — S161XXA Strain of muscle, fascia and tendon at neck level, initial encounter: Secondary | ICD-10-CM | POA: Insufficient documentation

## 2016-10-01 MED ORDER — CYCLOBENZAPRINE HCL 10 MG PO TABS: 10.0000 mg | ORAL_TABLET | Freq: Every day | ORAL | 0 refills | Status: DC

## 2016-10-01 MED ORDER — PREDNISONE 50 MG PO TABS: 50.0000 mg | ORAL_TABLET | Freq: Every day | ORAL | 0 refills | Status: DC

## 2016-10-01 MED ORDER — KETOROLAC TROMETHAMINE 60 MG/2ML IM SOLN
60.0000 mg | Freq: Once | INTRAMUSCULAR | Status: AC
  Administered 2016-10-01: 60 mg via INTRAMUSCULAR
  Filled 2016-10-01: qty 2

## 2016-10-01 MED ORDER — HYDROCODONE-ACETAMINOPHEN 5-325 MG PO TABS: 1.0000 | ORAL_TABLET | Freq: Four times a day (QID) | ORAL | 0 refills | Status: DC | PRN

## 2016-10-01 NOTE — Discharge Instructions (Signed)
Return here as needed.  Follow-up with the, Dr. provided.  Ice and heat to the area that is sore

## 2016-10-01 NOTE — ED Provider Notes (Signed)
[facility]-EMERGENCY DEPT Provider Note   CSN: [id number] Arrival date & time: 10/01/16  0830  By signing my name below, I, Octavia Heirrianna Nassar, attest that this documentation has been prepared under the direction and in the presence of Eli Lilly and CompanyChristopher Tymarion Everard, PA-C.  Electronically Signed: Octavia HeirArianna Nassar, ED Scribe. 10/01/16. 9:19 AM.    History   Chief Complaint Chief Complaint  Patient presents with  . Neck Pain  . Shoulder Pain   The history is provided by the patient. No language interpreter was used.   HPI Comments: Colleen Walls is a 35 y.o. female who has a PMHx of HIV presents to the Emergency Department complaining of sudden onset, gradual worsening left sided neck pain that radiates into her left shoulder x 3 weeks. Pt notes that she came home from work about 3 weeks ago with sudden pain in the area. 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 She denies any known trauma or injury. Pt expresses certain movements increase her pain. She has taken ibuprofen to relieve her pain without relief. She denies fever.  Past Medical History:  Diagnosis Date  . Abnormal Pap smear   . Asthma     Patient Active Problem List   Diagnosis Date Noted  . ASCUS with positive high risk HPV 09/17/2012  . HIV (human immunodeficiency virus infection) (HCC) 06/12/2012  . Boil 06/12/2012  . Asthma 06/12/2012    History reviewed. No pertinent surgical history.  OB History    Gravida Para Term Preterm AB Living   3 3 3     3    SAB TAB Ectopic Multiple Live Births                   Home Medications    Prior to Admission medications   Medication Sig Start Date End Date Taking? Authorizing Provider  acetaminophen (TYLENOL) 500 MG tablet Take 1 tablet (500 mg total) by mouth every 6 (six) hours as needed. 03/21/15   Ladona MowJoe Mintz, PA-C  albuterol (PROVENTIL HFA;VENTOLIN HFA) 108 (90 BASE) MCG/ACT inhaler Inhale 2 puffs into the lungs every 6 (six) hours as needed. For  shortness of breath or wheeze    Historical Provider, MD  elvitegravir-cobicistat-emtricitabine-tenofovir (GENVOYA) 150-150-200-10 MG TABS tablet Take 1 tablet by mouth daily with breakfast. 12/08/15   Judyann Munsonynthia Snider, MD  levonorgestrel-ethinyl estradiol (NORDETTE) 0.15-30 MG-MCG tablet Take 1 tablet by mouth daily. 02/12/15   Judyann Munsonynthia Snider, MD  promethazine (PHENERGAN) 25 MG tablet TAKE 1 TABLET BY MOUTH EVERY 6 HOURS AS NEEDED FOR NAUSEA 10/01/14   Judyann Munsonynthia Snider, MD    Family History No family history on file.  Social History Social History  Substance Use Topics  . Smoking status: Current Every Day Smoker    Packs/day: 1.00    Years: 5.00    Types: Cigarettes  . Smokeless tobacco: Never Used     Comment: setting "quit" date for March; interested in opiton  . Alcohol use Yes     Comment: occasional     Allergies   Patient has no known allergies.   Review of Systems Review of Systems  A complete 10 system review of systems was obtained and all systems are negative except as noted in the HPI and PMH.    Physical Exam Updated Vital Signs BP 131/88 (BP Location: Right Arm)   Pulse 107   Temp 98.3 F (36.8 C) (Oral)   Resp 17   Ht 5\' 3"  (1.6  m)   Wt 169 lb (76.7 kg)   LMP 09/11/2016 (Exact Date)   SpO2 100%   BMI 29.94 kg/m   Physical Exam  Constitutional: She is oriented to person, place, and time. She appears well-developed and well-nourished.  HENT:  Head: Normocephalic.  Eyes: EOM are normal.  Neck: Normal range of motion.  Pulmonary/Chest: Effort normal.  Abdominal: She exhibits no distension.  Musculoskeletal: Normal range of motion. She exhibits tenderness.  Left sided paraspinal cervical tenderness, good strength of the upper and lower extremities, distal pulses intact  Neurological: She is alert and oriented to person, place, and time.  Psychiatric: She has a normal mood and affect.  Nursing note and vitals reviewed.    ED Treatments / Results    DIAGNOSTIC STUDIES: Oxygen Saturation is 100% on RA, normal by my interpretation.  COORDINATION OF CARE:  9:17 AM Discussed treatment plan with pt at bedside and pt agreed to plan.  Labs (all labs ordered are listed, but only abnormal results are displayed) Labs Reviewed - No data to display  EKG  EKG Interpretation None       Radiology No results found.  Procedures Procedures (including critical care time)  Medications Ordered in ED Medications - No data to display   Initial Impression / Assessment and Plan / ED Course  I have reviewed the triage vital signs and the nursing notes.  Pertinent labs & imaging results that were available during my care of the patient were reviewed by me and considered in my medical decision making (see chart for details).  Clinical Course    Will be treated for cervical and trapezius muscle strain.  Told to return here as needed.  Advised follow-up with her primary care doctor   Final Clinical Impressions(s) / ED Diagnoses   Final diagnoses:  None   I personally performed the services described in this documentation, which was scribed in my presence. The recorded information has been reviewed and is accurate.  New Prescriptions New Prescriptions   No medications on file     Charlestine NightChristopher Donnetta Gillin, PA-C 10/02/16 1648    Gerhard Munchobert Lockwood, MD 10/05/16 [phone]

## 2016-10-01 NOTE — ED Triage Notes (Signed)
Pt c/o left sided neck and shoulder pain x 3 weeks ago. Pt denies injury. Pt has tried motrin, bengay and tylenol without relief.

## 2016-10-01 NOTE — ED Notes (Signed)
Declined W/C at D/C and was escorted to lobby by RN. 

## 2017-02-20 ENCOUNTER — Ambulatory Visit (INDEPENDENT_AMBULATORY_CARE_PROVIDER_SITE_OTHER): Payer: Medicaid Other | Admitting: Licensed Clinical Social Worker

## 2017-02-20 ENCOUNTER — Ambulatory Visit (INDEPENDENT_AMBULATORY_CARE_PROVIDER_SITE_OTHER): Payer: Medicaid Other | Admitting: Internal Medicine

## 2017-02-20 ENCOUNTER — Encounter: Payer: Self-pay | Admitting: Internal Medicine

## 2017-02-20 VITALS — BP 120/77 | HR 86 | Temp 98.9°F | Ht 63.0 in | Wt 166.0 lb

## 2017-02-20 DIAGNOSIS — B20 Human immunodeficiency virus [HIV] disease: Secondary | ICD-10-CM | POA: Diagnosis present

## 2017-02-20 DIAGNOSIS — Z79899 Other long term (current) drug therapy: Secondary | ICD-10-CM

## 2017-02-20 DIAGNOSIS — F320 Major depressive disorder, single episode, mild: Secondary | ICD-10-CM

## 2017-02-20 DIAGNOSIS — Z23 Encounter for immunization: Secondary | ICD-10-CM

## 2017-02-20 LAB — CBC WITH DIFFERENTIAL/PLATELET
BASOS ABS: 0 {cells}/uL (ref 0–200)
BASOS PCT: 0 %
EOS ABS: 138 {cells}/uL (ref 15–500)
Eosinophils Relative: 3 %
HCT: 37.6 % (ref 35.0–45.0)
HEMOGLOBIN: 12 g/dL (ref 11.7–15.5)
LYMPHS ABS: 2162 {cells}/uL (ref 850–3900)
Lymphocytes Relative: 47 %
MCH: 28.3 pg (ref 27.0–33.0)
MCHC: 31.9 g/dL — ABNORMAL LOW (ref 32.0–36.0)
MCV: 88.7 fL (ref 80.0–100.0)
MPV: 9.6 fL (ref 7.5–12.5)
Monocytes Absolute: 322 cells/uL (ref 200–950)
Monocytes Relative: 7 %
Neutro Abs: 1978 cells/uL (ref 1500–7800)
Neutrophils Relative %: 43 %
Platelets: 340 10*3/uL (ref 140–400)
RBC: 4.24 MIL/uL (ref 3.80–5.10)
RDW: 14.1 % (ref 11.0–15.0)
WBC: 4.6 10*3/uL (ref 3.8–10.8)

## 2017-02-20 NOTE — Progress Notes (Signed)
RFV: follow up for hiv disease Patient ID: Colleen Walls, female   DOB: 29-Dec-1980, 36 y.o.   MRN: [id number]  HPI 36yo F with well controlled hiv disease, cd 4 count of 490/VL<20 on genvoya. She is having 2 of her children meeting large milestones this month, one going into national guard training, other going to college. She notices being more emotionally labile with the thought of them leaving. She continues to go to school. 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  Soc hx: smoking trending down to 1 cig per day Outpatient Encounter Prescriptions as of 02/20/2017  Medication Sig  . albuterol (PROVENTIL HFA;VENTOLIN HFA) 108 (90 BASE) MCG/ACT inhaler Inhale 2 puffs into the lungs every 6 (six) hours as needed. For shortness of breath or wheeze  . cyclobenzaprine (FLEXERIL) 10 MG tablet Take 1 tablet (10 mg total) by mouth at bedtime.  . elvitegravir-cobicistat-emtricitabine-tenofovir (GENVOYA) 150-150-200-10 MG TABS tablet Take 1 tablet by mouth daily with breakfast.  . levonorgestrel-ethinyl estradiol (NORDETTE) 0.15-30 MG-MCG tablet Take 1 tablet by mouth daily.  . promethazine (PHENERGAN) 25 MG tablet TAKE 1 TABLET BY MOUTH EVERY 6 HOURS AS NEEDED FOR NAUSEA  . [DISCONTINUED] acetaminophen (TYLENOL) 500 MG tablet Take 1 tablet (500 mg total) by mouth every 6 (six) hours as needed.  . [DISCONTINUED] HYDROcodone-acetaminophen (NORCO/VICODIN) 5-325 MG tablet Take 1 tablet by mouth every 6 (six) hours as needed for moderate pain.  . [DISCONTINUED] predniSONE (DELTASONE) 50 MG tablet Take 1 tablet (50 mg total) by mouth daily.   No facility-administered encounter medications on file as of 02/20/2017.      Patient Active Problem List   Diagnosis Date Noted  . ASCUS with positive high risk HPV 09/17/2012  . HIV (human immunodeficiency virus infection) (HCC) 06/12/2012  . Boil 06/12/2012  . Asthma 06/12/2012     There are no preventive care reminders to display for this  patient.   Review of Systems Review of Systems  Constitutional: Negative for fever, chills, diaphoresis, activity change, appetite change, fatigue and unexpected weight change.  HENT: Negative for congestion, sore throat, rhinorrhea, sneezing, trouble swallowing and sinus pressure.  Eyes: Negative for photophobia and visual disturbance.  Respiratory: Negative for cough, chest tightness, shortness of breath, wheezing and stridor.  Cardiovascular: Negative for chest pain, palpitations and leg swelling.  Gastrointestinal: Negative for nausea, vomiting, abdominal pain, diarrhea, constipation, blood in stool, abdominal distention and anal bleeding.  Genitourinary: Negative for dysuria, hematuria, flank pain and difficulty urinating.  Musculoskeletal: Negative for myalgias, back pain, joint swelling, arthralgias and gait problem.  Skin: Negative for color change, pallor, rash and wound.  Neurological: Negative for dizziness, tremors, weakness and light-headedness.  Hematological: Negative for adenopathy. Does not bruise/bleed easily.  Psychiatric/Behavioral: Negative for behavioral problems, confusion, sleep disturbance, dysphoric mood, decreased concentration and agitation.    Physical Exam  BP 120/77   Pulse 86   Temp 98.9 F (37.2 C) (Oral)   Ht 5\' 3"  (1.6 m)   Wt 166 lb (75.3 kg)   LMP 01/30/2017   BMI 29.41 kg/m  Physical Exam  Constitutional:  oriented to person, place, and time. appears well-developed and well-nourished. No distress.  HENT: [place]/AT, PERRLA, no scleral icterus Mouth/Throat: Oropharynx is clear and moist. No oropharyngeal exudate.  Cardiovascular: Normal rate, regular rhythm and normal heart sounds. Exam reveals no gallop and no friction rub.  No murmur heard.  Pulmonary/Chest: Effort normal and breath sounds normal. No respiratory distress.  has no wheezes.  Neck = supple, no nuchal rigidity Lymphadenopathy: no cervical adenopathy. No axillary  adenopathy Neurological: alert and oriented to person, place, and time.  Skin: Skin is warm and dry. No rash noted. No erythema.  Psychiatric: tearful during appointment  Lab Results  Component Value Date   CD4TCELL 27 (L) 07/14/2016   Lab Results  Component Value Date   CD4TABS 490 07/14/2016   CD4TABS 540 12/08/2015   CD4TABS 490 05/19/2015   Lab Results  Component Value Date   HIV1RNAQUANT <20 07/14/2016   Lab Results  Component Value Date   HEPBSAB POS (A) 06/12/2012   Lab Results  Component Value Date   LABRPR NON REAC 12/08/2015    CBC Lab Results  Component Value Date   WBC 4.3 07/14/2016   RBC 4.02 07/14/2016   HGB 11.7 07/14/2016   HCT 35.7 07/14/2016   PLT 309 07/14/2016   MCV 88.8 07/14/2016   MCH 29.1 07/14/2016   MCHC 32.8 07/14/2016   RDW 13.6 07/14/2016   LYMPHSABS 1,591 07/14/2016   MONOABS 817 07/14/2016   EOSABS 86 07/14/2016    BMET Lab Results  Component Value Date   NA 138 07/14/2016   K 4.5 07/14/2016   CL 109 07/14/2016   CO2 21 07/14/2016   GLUCOSE 83 07/14/2016   BUN 11 07/14/2016   CREATININE 0.82 07/14/2016   CALCIUM 8.9 07/14/2016   GFRNONAA 85 12/08/2015   GFRAA >89 12/08/2015    Assessment and Plan  HIV disease = will get labs. Well controlled. Continue on genvoya  Long term medication monitoring = no change in renal function. May consider changing to bikartvy at next appt.  Health maintenance =will give meningococcal vaccine. wil get pap appt  Birth control = continue on ocp

## 2017-02-21 LAB — COMPLETE METABOLIC PANEL WITH GFR
ALBUMIN: 4.3 g/dL (ref 3.6–5.1)
ALK PHOS: 61 U/L (ref 33–115)
ALT: 16 U/L (ref 6–29)
AST: 23 U/L (ref 10–30)
BILIRUBIN TOTAL: 0.5 mg/dL (ref 0.2–1.2)
BUN: 6 mg/dL — AB (ref 7–25)
CO2: 25 mmol/L (ref 20–31)
CREATININE: 0.96 mg/dL (ref 0.50–1.10)
Calcium: 9.4 mg/dL (ref 8.6–10.2)
Chloride: 107 mmol/L (ref 98–110)
GFR, EST NON AFRICAN AMERICAN: 76 mL/min (ref >=60)
GFR, Est African American: 88 mL/min (ref >=60)
GLUCOSE: 81 mg/dL (ref 65–99)
Potassium: 4.6 mmol/L (ref 3.5–5.3)
SODIUM: 139 mmol/L (ref 135–146)
Total Protein: 7.3 g/dL (ref 6.1–8.1)

## 2017-02-21 LAB — T-HELPER CELL (CD4) - (RCID CLINIC ONLY)
CD4 % Helper T Cell: 28 % — ABNORMAL LOW (ref 33–55)
CD4 T Cell Abs: 680 /uL (ref 400–2700)

## 2017-02-21 LAB — LIPID PANEL
Cholesterol: 190 mg/dL (ref <200)
HDL: 63 mg/dL (ref >50)
LDL CALC: 107 mg/dL — AB (ref <100)
Total CHOL/HDL Ratio: 3 Ratio (ref <5.0)
Triglycerides: 100 mg/dL (ref <150)
VLDL: 20 mg/dL (ref <30)

## 2017-02-21 LAB — RPR

## 2017-02-22 LAB — HIV-1 RNA QUANT-NO REFLEX-BLD
HIV 1 RNA Quant: 27 copies/mL — ABNORMAL HIGH
HIV-1 RNA Quant, Log: 1.43 Log copies/mL — ABNORMAL HIGH

## 2017-03-03 ENCOUNTER — Ambulatory Visit: Payer: Medicaid Other

## 2017-03-21 NOTE — Progress Notes (Signed)
  Name: Colleen Walls  MRN: [id number]  Date: 02/20/17  Time started: 2:55 pm    Time ended: 3:25 pm  Behavior: Tearful; friendly and cooperative Mood: Anxious and sad Affect: Constricted Thought Process: Quiet, thought blocking but coherent Thought Content: Logical Thought Disorder and Perceptual Anomalies: None observed or self-reported S/I and H/I: Denies current ideations, plan, or intent  Referral Source: Dr. Baxter Flattery  Content of Session: Met with patient based on referral, due to depression and crying in the exam room.  Intervention Provided: Assessed history of treatment and assessed current symptoms. Reviewed treatment options for major depression. Educated patient on stress reduction physical exercise and relaxation techniques.  Encouraged patient to express intense internalized emotions through different media.  Response: Patient denied having a history of mental health treatment, although needed, due to her "fears" and stigma of receiving treatment.  Patient reported her current mood as "silly" and denied experiencing a sad or depressed mood, but explained that it was better for her to present as happy even though she feels differently.  Patient reported decreased energy level with fatigue and reported receiving 4-5 hours of interrupted sleep.  Patient reported emotional eating, increased social isolation, feelings of worthlessness, and having a hopeless outlook at times.  The patient denied having diminished ability to think and denied anhedonia.  Patient reported that she likes to watch tv but denied having a favorite tv show and admitted she likes to zone out. Patient denied having past suicide attempts and denied having current specific or general suicidal ideations, and denied plan and intent.  Patient was receptive to depression treatment options and stated preference for individual counseling currently, but was also open to receiving medication management at later date.  Patient was  receptive to examining her body posture and was open to participating in stress reduction exercise and was able to perform without incident.  Patient was asked to be mindful of holding stress and tension in neck area and to check for the distance between her ears and her shoulders. Patient was receptive to the role of her crossed feet and arms in contributing to stress and reported she was holding on to "secret" but denied there being enough trust to verbalize today.  The patient shared that she has been holding a secret from everyone for so long that she cannot remember for how long.  Patient was receptive of the physical manifestations and dangers of holding onto strong, intense internalized feelings and was open to writing down her strong emotions on paper and trashing it or typing on phone and erasing it. Patient shared that she may be willing to explore her secret in follow-up sessions with [facility].  Plan: Patient was willing to schedule follow-up sessions with [facility] and was accepting that six sessions can be performed before a referral to community mental health agencies would be needed. Patient will be scheduled with [facility].  Sande Rives, [facility]

## 2017-03-22 ENCOUNTER — Telehealth: Payer: Self-pay | Admitting: *Deleted

## 2017-03-22 NOTE — Telephone Encounter (Signed)
Patient requesting albuterol inhalers from Dr Drue SecondSnider.  This is listed as a historic medication, has not been prescribed by anyone in [place] before.  Please advise if ok to fill, wants to use [facility]. Andree CossHowell, Michelle M, RN

## 2017-03-23 ENCOUNTER — Other Ambulatory Visit: Payer: Self-pay | Admitting: *Deleted

## 2017-03-23 DIAGNOSIS — J45909 Unspecified asthma, uncomplicated: Secondary | ICD-10-CM

## 2017-03-23 DIAGNOSIS — B20 Human immunodeficiency virus [HIV] disease: Secondary | ICD-10-CM

## 2017-03-23 MED ORDER — ALBUTEROL SULFATE HFA 108 (90 BASE) MCG/ACT IN AERS: 2.0000 | INHALATION_SPRAY | Freq: Four times a day (QID) | RESPIRATORY_TRACT | 0 refills | Status: DC | PRN

## 2017-03-23 MED ORDER — ELVITEG-COBIC-EMTRICIT-TENOFAF 150-150-200-10 MG PO TABS: 1.0000 | ORAL_TABLET | Freq: Every day | ORAL | 5 refills | Status: DC

## 2017-03-23 NOTE — Telephone Encounter (Signed)
Thanks

## 2017-03-23 NOTE — Telephone Encounter (Signed)
I believe she had mentioned that she had used albuterol inhaler. Can do 1 albuterol inhaler 2 puffs q 6hr as needed NR

## 2017-04-03 ENCOUNTER — Telehealth: Payer: Self-pay | Admitting: Licensed Clinical Social Worker

## 2017-04-03 NOTE — Telephone Encounter (Signed)
Left message with family member for return phone call, providing my name and number only.  Vergia AlbertsSherry Juliocesar Blasius, [facility]

## 2017-04-13 ENCOUNTER — Telehealth: Payer: Self-pay | Admitting: Licensed Clinical Social Worker

## 2017-04-13 NOTE — Telephone Encounter (Signed)
Left voice mail regarding arranging appointment and asking for return phone call.  Colleen Walls, [facility]

## 2017-07-04 ENCOUNTER — Encounter: Payer: Self-pay | Admitting: Internal Medicine

## 2017-08-23 ENCOUNTER — Encounter: Payer: Self-pay | Admitting: Internal Medicine

## 2017-08-23 ENCOUNTER — Ambulatory Visit (INDEPENDENT_AMBULATORY_CARE_PROVIDER_SITE_OTHER): Payer: Medicaid Other | Admitting: Internal Medicine

## 2017-08-23 VITALS — BP 117/76 | HR 96 | Temp 98.7°F | Ht 63.0 in | Wt 163.0 lb

## 2017-08-23 DIAGNOSIS — B20 Human immunodeficiency virus [HIV] disease: Secondary | ICD-10-CM

## 2017-08-23 DIAGNOSIS — Z23 Encounter for immunization: Secondary | ICD-10-CM | POA: Diagnosis not present

## 2017-08-23 LAB — COMPLETE METABOLIC PANEL WITH GFR
AG RATIO: 1.5 (calc) (ref 1.0–2.5)
ALKALINE PHOSPHATASE (APISO): 67 U/L (ref 33–115)
ALT: 14 U/L (ref 6–29)
AST: 20 U/L (ref 10–30)
Albumin: 4.3 g/dL (ref 3.6–5.1)
BUN: 10 mg/dL (ref 7–25)
CO2: 23 mmol/L (ref 20–32)
Calcium: 9.4 mg/dL (ref 8.6–10.2)
Chloride: 108 mmol/L (ref 98–110)
Creat: 0.87 mg/dL (ref 0.50–1.10)
GFR, Est African American: 99 mL/min/{1.73_m2} (ref >=60)
GFR, Est Non African American: 85 mL/min/{1.73_m2} (ref >=60)
GLOBULIN: 2.9 g/dL (ref 1.9–3.7)
Glucose, Bld: 84 mg/dL (ref 65–99)
POTASSIUM: 4.6 mmol/L (ref 3.5–5.3)
SODIUM: 138 mmol/L (ref 135–146)
Total Bilirubin: 0.4 mg/dL (ref 0.2–1.2)
Total Protein: 7.2 g/dL (ref 6.1–8.1)

## 2017-08-23 LAB — CBC WITH DIFFERENTIAL/PLATELET
BASOS ABS: 40 {cells}/uL (ref 0–200)
Basophils Relative: 0.6 %
Eosinophils Absolute: 92 cells/uL (ref 15–500)
Eosinophils Relative: 1.4 %
HEMATOCRIT: 38.2 % (ref 35.0–45.0)
HEMOGLOBIN: 12.5 g/dL (ref 11.7–15.5)
LYMPHS ABS: 3148 {cells}/uL (ref 850–3900)
MCH: 28.5 pg (ref 27.0–33.0)
MCHC: 32.7 g/dL (ref 32.0–36.0)
MCV: 87 fL (ref 80.0–100.0)
MPV: 10.7 fL (ref 7.5–12.5)
Monocytes Relative: 9.4 %
NEUTROS ABS: 2699 {cells}/uL (ref 1500–7800)
NEUTROS PCT: 40.9 %
Platelets: 338 10*3/uL (ref 140–400)
RBC: 4.39 10*6/uL (ref 3.80–5.10)
RDW: 13.4 % (ref 11.0–15.0)
Total Lymphocyte: 47.7 %
WBC: 6.6 10*3/uL (ref 3.8–10.8)
WBCMIX: 620 {cells}/uL (ref 200–950)

## 2017-08-23 NOTE — Progress Notes (Signed)
Patient ID: Colleen Walls, female   DOB: Apr 10, 1981, 36 y.o.   MRN: [id number]  HPI 36yo F with hiv disease, CD 4 count of 680/VL27 (may 2018) on genovya. Very busy with work, school and kids. Often not eating more than 2 meals due to being busy. Sleeps 3-5hr per night. Associated weight loss  No smoking in the last 2 months  Outpatient Encounter Medications as of 08/23/2017  Medication Sig  . albuterol (PROVENTIL HFA;VENTOLIN HFA) 108 (90 Base) MCG/ACT inhaler Inhale 2 puffs into the lungs every 6 (six) hours as needed. For shortness of breath or wheeze  . elvitegravir-cobicistat-emtricitabine-tenofovir (GENVOYA) 150-150-200-10 MG TABS tablet Take 1 tablet by mouth daily with breakfast.  . levonorgestrel-ethinyl estradiol (NORDETTE) 0.15-30 MG-MCG tablet Take 1 tablet by mouth daily.  . promethazine (PHENERGAN) 25 MG tablet TAKE 1 TABLET BY MOUTH EVERY 6 HOURS AS NEEDED FOR NAUSEA (Patient not taking: Reported on 08/23/2017)   No facility-administered encounter medications on file as of 08/23/2017.      Patient Active Problem List   Diagnosis Date Noted  . ASCUS with positive high risk HPV 09/17/2012  . HIV (human immunodeficiency virus infection) (HCC) 06/12/2012  . Boil 06/12/2012  . Asthma 06/12/2012     Health Maintenance Due  Topic Date Due  . INFLUENZA VACCINE  05/10/2017     Review of Systems Review of Systems  Constitutional: Negative for fever, chills, diaphoresis, activity change, appetite change, fatigue and unexpected weight change.  HENT: Negative for congestion, sore throat, rhinorrhea, sneezing, trouble swallowing and sinus pressure.  Eyes: Negative for photophobia and visual disturbance.  Respiratory: Negative for cough, chest tightness, shortness of breath, wheezing and stridor.  Cardiovascular: Negative for chest pain, palpitations and leg swelling.  Gastrointestinal: Negative for nausea, vomiting, abdominal pain, diarrhea, constipation, blood in stool,  abdominal distention and anal bleeding.  Genitourinary: Negative for dysuria, hematuria, flank pain and difficulty urinating.  Musculoskeletal: Negative for myalgias, back pain, joint swelling, arthralgias and gait problem.  Skin: Negative for color change, pallor, rash and wound.  Neurological: Negative for dizziness, tremors, weakness and light-headedness.  Hematological: Negative for adenopathy. Does not bruise/bleed easily.  Psychiatric/Behavioral: Negative for behavioral problems, confusion, sleep disturbance, dysphoric mood, decreased concentration and agitation.    Physical Exam   BP 117/76   Pulse 96   Temp 98.7 F (37.1 C) (Oral)   Ht 5\' 3"  (1.6 m)   Wt 163 lb (73.9 kg)   LMP 08/04/2017 (Exact Date)   BMI 28.87 kg/m   Physical Exam  Constitutional:  oriented to person, place, and time. appears well-developed and well-nourished. No distress.  HENT: /AT, PERRLA, no scleral icterus Mouth/Throat: Oropharynx is clear and moist. No oropharyngeal exudate.  Cardiovascular: Normal rate, regular rhythm and normal heart sounds. Exam reveals no gallop and no friction rub.  No murmur heard.  Pulmonary/Chest: Effort normal and breath sounds normal. No respiratory distress.  has no wheezes.  Neck = supple, no nuchal rigidity Lymphadenopathy: no cervical adenopathy. No axillary adenopathy Neurological: alert and oriented to person, place, and time.  Skin: Skin is warm and dry. No rash noted. No erythema.  Psychiatric: a normal mood and affect.  behavior is normal.   Lab Results  Component Value Date   CD4TCELL 28 (L) 02/20/2017   Lab Results  Component Value Date   CD4TABS 680 02/20/2017   CD4TABS 490 07/14/2016   CD4TABS 540 12/08/2015   Lab Results  Component Value Date   HIV1RNAQUANT 27 (H) 02/20/2017  Lab Results  Component Value Date   HEPBSAB POS (A) 06/12/2012   Lab Results  Component Value Date   LABRPR NON REAC 02/20/2017    CBC Lab Results  Component  Value Date   WBC 4.6 02/20/2017   RBC 4.24 02/20/2017   HGB 12.0 02/20/2017   HCT 37.6 02/20/2017   PLT 340 02/20/2017   MCV 88.7 02/20/2017   MCH 28.3 02/20/2017   MCHC 31.9 (L) 02/20/2017   RDW 14.1 02/20/2017   LYMPHSABS 2,162 02/20/2017   MONOABS 322 02/20/2017   EOSABS 138 02/20/2017    BMET Lab Results  Component Value Date   NA 139 02/20/2017   K 4.6 02/20/2017   CL 107 02/20/2017   CO2 25 02/20/2017   GLUCOSE 81 02/20/2017   BUN 6 (L) 02/20/2017   CREATININE 0.96 02/20/2017   CALCIUM 9.4 02/20/2017   GFRNONAA 76 02/20/2017   GFRAA 88 02/20/2017      Assessment and Plan hiv disease = well controlled, but have not done recent labs - will check labs, continue on genvoya  Health maintenance = will give flu shot today  Smoking cessation = congratulated her on smoking cessation.

## 2017-08-25 LAB — HIV-1 RNA QUANT-NO REFLEX-BLD
HIV 1 RNA Quant: 107 copies/mL — ABNORMAL HIGH
HIV-1 RNA QUANT, LOG: 2.03 {Log_copies}/mL — AB

## 2017-08-25 LAB — T-HELPER CELL (CD4) - (RCID CLINIC ONLY)
CD4 T CELL ABS: 900 /uL (ref 400–2700)
CD4 T CELL HELPER: 26 % — AB (ref 33–55)

## 2017-11-15 ENCOUNTER — Encounter: Payer: Self-pay | Admitting: Internal Medicine

## 2017-11-23 ENCOUNTER — Ambulatory Visit (INDEPENDENT_AMBULATORY_CARE_PROVIDER_SITE_OTHER): Payer: Medicaid Other | Admitting: Internal Medicine

## 2017-11-23 ENCOUNTER — Encounter: Payer: Self-pay | Admitting: Internal Medicine

## 2017-11-23 ENCOUNTER — Other Ambulatory Visit: Payer: Self-pay | Admitting: Internal Medicine

## 2017-11-23 VITALS — BP 111/70 | HR 83 | Temp 99.5°F | Wt 164.0 lb

## 2017-11-23 DIAGNOSIS — B20 Human immunodeficiency virus [HIV] disease: Secondary | ICD-10-CM | POA: Diagnosis present

## 2017-11-23 DIAGNOSIS — J45909 Unspecified asthma, uncomplicated: Secondary | ICD-10-CM

## 2017-11-23 DIAGNOSIS — Z79899 Other long term (current) drug therapy: Secondary | ICD-10-CM

## 2017-11-23 NOTE — Progress Notes (Signed)
RFV: follow up with HIV disease  Patient ID: Colleen Walls, female   DOB: [birth year], 37 y.o.   MRN: [id number]  HPI 36yo f with well controlled HIV disease. She reports being in overall good health.  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  She has 10th grader at home  Outpatient Encounter Medications as of 11/23/2017  Medication Sig  . elvitegravir-cobicistat-emtricitabine-tenofovir (GENVOYA) 150-150-200-10 MG TABS tablet Take 1 tablet by mouth daily with breakfast.  . PROVENTIL HFA 108 (90 Base) MCG/ACT inhaler INHALE 2 PUFFS INTO THE LUNGS EVERY 6 HOURS AS NEEDED FOR SHORTNESS OF BREATH OR WHEEZING  . levonorgestrel-ethinyl estradiol (NORDETTE) 0.15-30 MG-MCG tablet Take 1 tablet by mouth daily. (Patient not taking: Reported on 11/23/2017)  . promethazine (PHENERGAN) 25 MG tablet TAKE 1 TABLET BY MOUTH EVERY 6 HOURS AS NEEDED FOR NAUSEA (Patient not taking: Reported on 08/23/2017)   No facility-administered encounter medications on file as of 11/23/2017.      Patient Active Problem List   Diagnosis Date Noted  . ASCUS with positive high risk HPV 09/17/2012  . HIV (human immunodeficiency virus infection) (HCC) 06/12/2012  . Boil 06/12/2012  . Asthma 06/12/2012   Social History   Tobacco Use  . Smoking status: Current Every Day Smoker    Packs/day: 0.50    Years: 5.00    Pack years: 2.50    Types: Cigarettes  . Smokeless tobacco: Never Used  . Tobacco comment: setting "quit" date for March; interested in opiton  Substance Use Topics  . Alcohol use: Yes    Comment: occasional  . Drug use: No    There are no preventive care reminders to display for this patient.   Review of Systems Review of Systems  Constitutional: Negative for fever, chills, diaphoresis, activity change, appetite change, fatigue and unexpected weight change.  HENT:  Negative for congestion, sore throat, rhinorrhea, sneezing, trouble swallowing and sinus pressure.  Eyes: Negative for photophobia and visual disturbance.  Respiratory: Negative for cough, chest tightness, shortness of breath, wheezing and stridor.  Cardiovascular: Negative for chest pain, palpitations and leg swelling.  Gastrointestinal: Negative for nausea, vomiting, abdominal pain, diarrhea, constipation, blood in stool, abdominal distention and anal bleeding.  Genitourinary: Negative for dysuria, hematuria, flank pain and difficulty urinating.  Musculoskeletal: Negative for myalgias, back pain, joint swelling, arthralgias and gait problem.  Skin: Negative for color change, pallor, rash and wound.  Neurological: Negative for dizziness, tremors, weakness and light-headedness.  Hematological: Negative for adenopathy. Does not bruise/bleed easily.  Psychiatric/Behavioral: Negative for behavioral problems, confusion, sleep disturbance, dysphoric mood, decreased concentration and agitation.    Physical Exam   BP 111/70   Pulse 83   Temp 99.5 F (37.5 C) (Oral)   Wt 164 lb (74.4 kg)   BMI 29.05 kg/m   Physical Exam  Constitutional:  oriented to person, place, and time. appears well-developed and well-nourished. No distress.  HENT: [place]/AT, PERRLA, no scleral icterus Mouth/Throat: Oropharynx is clear and moist. No oropharyngeal exudate.  Cardiovascular: Normal rate, regular rhythm and normal heart sounds. Exam reveals no gallop and no friction rub.  No murmur heard.  Pulmonary/Chest: Effort normal and breath sounds normal. No respiratory distress.  has no wheezes.  Neck = supple, no nuchal rigidity Abdominal: Soft. Bowel sounds are normal.  exhibits no distension. There is no tenderness.  Lymphadenopathy: no cervical adenopathy.  No axillary adenopathy Neurological: alert and oriented to person, place, and time.  Skin: Skin is warm and dry. No rash noted. No erythema.  Psychiatric: a  normal mood and affect.  behavior is normal.   Lab Results  Component Value Date   CD4TCELL 26 (L) 08/23/2017   Lab Results  Component Value Date   CD4TABS 900 08/23/2017   CD4TABS 680 02/20/2017   CD4TABS 490 07/14/2016   Lab Results  Component Value Date   HIV1RNAQUANT 107 (H) 08/23/2017   Lab Results  Component Value Date   HEPBSAB POS (A) 06/12/2012   Lab Results  Component Value Date   LABRPR NON REAC 02/20/2017    CBC Lab Results  Component Value Date   WBC 6.6 08/23/2017   RBC 4.39 08/23/2017   HGB 12.5 08/23/2017   HCT 38.2 08/23/2017   PLT 338 08/23/2017   MCV 87.0 08/23/2017   MCH 28.5 08/23/2017   MCHC 32.7 08/23/2017   RDW 13.4 08/23/2017   LYMPHSABS 3,148 08/23/2017   MONOABS 322 02/20/2017   EOSABS 92 08/23/2017    BMET Lab Results  Component Value Date   NA 138 08/23/2017   K 4.6 08/23/2017   CL 108 08/23/2017   CO2 23 08/23/2017   GLUCOSE 84 08/23/2017   BUN 10 08/23/2017   CREATININE 0.87 08/23/2017   CALCIUM 9.4 08/23/2017   GFRNONAA 85 08/23/2017   GFRAA 99 08/23/2017      Assessment and Plan  HIV DISEASE = she has low level viremia but reports excellent adherence. Will check  lab work today  Women health = need pap - set up in NP clinic  Previous weightloss = now resolved  Long term monitoring for medication use = kidney function is stable. No need to adjust ART

## 2017-11-24 LAB — COMPLETE METABOLIC PANEL WITH GFR
AG RATIO: 1.6 (calc) (ref 1.0–2.5)
ALBUMIN MSPROF: 4.3 g/dL (ref 3.6–5.1)
ALT: 16 U/L (ref 6–29)
AST: 22 U/L (ref 10–30)
Alkaline phosphatase (APISO): 66 U/L (ref 33–115)
BUN: 8 mg/dL (ref 7–25)
CALCIUM: 9.5 mg/dL (ref 8.6–10.2)
CO2: 22 mmol/L (ref 20–32)
Chloride: 109 mmol/L (ref 98–110)
Creat: 0.9 mg/dL (ref 0.50–1.10)
GFR, EST AFRICAN AMERICAN: 95 mL/min/{1.73_m2} (ref >=60)
GFR, EST NON AFRICAN AMERICAN: 82 mL/min/{1.73_m2} (ref >=60)
GLOBULIN: 2.7 g/dL (ref 1.9–3.7)
Glucose, Bld: 108 mg/dL — ABNORMAL HIGH (ref 65–99)
Potassium: 4.6 mmol/L (ref 3.5–5.3)
SODIUM: 139 mmol/L (ref 135–146)
Total Bilirubin: 0.5 mg/dL (ref 0.2–1.2)
Total Protein: 7 g/dL (ref 6.1–8.1)

## 2017-11-24 LAB — T-HELPER CELL (CD4) - (RCID CLINIC ONLY)
CD4 % Helper T Cell: 26 % — ABNORMAL LOW (ref 33–55)
CD4 T Cell Abs: 740 /uL (ref 400–2700)

## 2017-11-24 LAB — RPR: RPR Ser Ql: NONREACTIVE

## 2017-11-24 LAB — CBC WITH DIFFERENTIAL/PLATELET
BASOS ABS: 50 {cells}/uL (ref 0–200)
Basophils Relative: 0.7 %
Eosinophils Absolute: 130 cells/uL (ref 15–500)
Eosinophils Relative: 1.8 %
HEMATOCRIT: 36.7 % (ref 35.0–45.0)
HEMOGLOBIN: 12.4 g/dL (ref 11.7–15.5)
Lymphs Abs: 2995 cells/uL (ref 850–3900)
MCH: 28.8 pg (ref 27.0–33.0)
MCHC: 33.8 g/dL (ref 32.0–36.0)
MCV: 85.3 fL (ref 80.0–100.0)
MONOS PCT: 9.8 %
MPV: 10.5 fL (ref 7.5–12.5)
NEUTROS ABS: 3319 {cells}/uL (ref 1500–7800)
Neutrophils Relative %: 46.1 %
Platelets: 320 10*3/uL (ref 140–400)
RBC: 4.3 10*6/uL (ref 3.80–5.10)
RDW: 13.6 % (ref 11.0–15.0)
Total Lymphocyte: 41.6 %
WBC mixed population: 706 cells/uL (ref 200–950)
WBC: 7.2 10*3/uL (ref 3.8–10.8)

## 2017-11-26 LAB — HIV-1 RNA QUANT-NO REFLEX-BLD
HIV 1 RNA Quant: <20 copies/mL
HIV-1 RNA Quant, Log: <1.3 Log copies/mL

## 2017-12-27 ENCOUNTER — Ambulatory Visit: Payer: Medicaid Other | Admitting: Internal Medicine

## 2018-02-07 ENCOUNTER — Other Ambulatory Visit: Payer: Self-pay | Admitting: *Deleted

## 2018-02-07 ENCOUNTER — Other Ambulatory Visit: Payer: Self-pay

## 2018-02-07 DIAGNOSIS — B20 Human immunodeficiency virus [HIV] disease: Secondary | ICD-10-CM

## 2018-02-07 LAB — CBC WITH DIFFERENTIAL/PLATELET
BASOS ABS: 31 {cells}/uL (ref 0–200)
Basophils Relative: 0.4 %
EOS PCT: 1.3 %
Eosinophils Absolute: 100 cells/uL (ref 15–500)
HEMATOCRIT: 34.9 % — AB (ref 35.0–45.0)
HEMOGLOBIN: 11.7 g/dL (ref 11.7–15.5)
LYMPHS ABS: 3142 {cells}/uL (ref 850–3900)
MCH: 29.4 pg (ref 27.0–33.0)
MCHC: 33.5 g/dL (ref 32.0–36.0)
MCV: 87.7 fL (ref 80.0–100.0)
MONOS PCT: 8.8 %
MPV: 10.2 fL (ref 7.5–12.5)
NEUTROS ABS: 3750 {cells}/uL (ref 1500–7800)
Neutrophils Relative %: 48.7 %
Platelets: 291 10*3/uL (ref 140–400)
RBC: 3.98 10*6/uL (ref 3.80–5.10)
RDW: 13.4 % (ref 11.0–15.0)
Total Lymphocyte: 40.8 %
WBC mixed population: 678 cells/uL (ref 200–950)
WBC: 7.7 10*3/uL (ref 3.8–10.8)

## 2018-02-08 LAB — COMPLETE METABOLIC PANEL WITH GFR
AG RATIO: 1.7 (calc) (ref 1.0–2.5)
ALT: 12 U/L (ref 6–29)
AST: 19 U/L (ref 10–30)
Albumin: 4 g/dL (ref 3.6–5.1)
Alkaline phosphatase (APISO): 71 U/L (ref 33–115)
BUN: 9 mg/dL (ref 7–25)
CALCIUM: 9.1 mg/dL (ref 8.6–10.2)
CO2: 26 mmol/L (ref 20–32)
CREATININE: 1 mg/dL (ref 0.50–1.10)
Chloride: 109 mmol/L (ref 98–110)
GFR, EST NON AFRICAN AMERICAN: 72 mL/min/{1.73_m2} (ref >=60)
GFR, Est African American: 83 mL/min/{1.73_m2} (ref >=60)
GLUCOSE: 98 mg/dL (ref 65–99)
Globulin: 2.4 g/dL (calc) (ref 1.9–3.7)
Potassium: 4.4 mmol/L (ref 3.5–5.3)
Sodium: 140 mmol/L (ref 135–146)
TOTAL PROTEIN: 6.4 g/dL (ref 6.1–8.1)
Total Bilirubin: 0.3 mg/dL (ref 0.2–1.2)

## 2018-02-09 LAB — HIV-1 RNA QUANT-NO REFLEX-BLD
HIV 1 RNA Quant: 35 copies/mL — ABNORMAL HIGH
HIV-1 RNA QUANT, LOG: 1.54 {Log_copies}/mL — AB

## 2018-02-09 LAB — T-HELPER CELL (CD4) - (RCID CLINIC ONLY)
CD4 % Helper T Cell: 26 % — ABNORMAL LOW (ref 33–55)
CD4 T Cell Abs: 890 /uL (ref 400–2700)

## 2018-02-15 ENCOUNTER — Encounter: Payer: Self-pay | Admitting: Internal Medicine

## 2018-02-21 ENCOUNTER — Ambulatory Visit: Payer: Medicaid Other | Admitting: Internal Medicine

## 2018-03-01 ENCOUNTER — Telehealth: Payer: Self-pay | Admitting: Pharmacist Clinician (PhC)/ Clinical Pharmacy Specialist

## 2018-03-01 NOTE — Telephone Encounter (Signed)
Not sure why Colleen Walls was filed for patient assistance with [employer]. She has active Medicaid.

## 2018-03-08 ENCOUNTER — Emergency Department (HOSPITAL_COMMUNITY): Payer: Medicaid Other

## 2018-03-08 ENCOUNTER — Encounter (HOSPITAL_COMMUNITY): Payer: Self-pay

## 2018-03-08 ENCOUNTER — Emergency Department (HOSPITAL_COMMUNITY)
Admission: EM | Admit: 2018-03-08 | Discharge: 2018-03-08 | Disposition: A | Discharge status: Home / Self Care | Payer: Medicaid Other | Attending: Emergency Medicine | Admitting: Emergency Medicine

## 2018-03-08 DIAGNOSIS — B20 Human immunodeficiency virus [HIV] disease: Secondary | ICD-10-CM | POA: Insufficient documentation

## 2018-03-08 DIAGNOSIS — N739 Female pelvic inflammatory disease, unspecified: Secondary | ICD-10-CM | POA: Insufficient documentation

## 2018-03-08 DIAGNOSIS — B9689 Other specified bacterial agents as the cause of diseases classified elsewhere: Secondary | ICD-10-CM

## 2018-03-08 DIAGNOSIS — N760 Acute vaginitis: Secondary | ICD-10-CM | POA: Insufficient documentation

## 2018-03-08 DIAGNOSIS — J45909 Unspecified asthma, uncomplicated: Secondary | ICD-10-CM | POA: Diagnosis not present

## 2018-03-08 DIAGNOSIS — Z79899 Other long term (current) drug therapy: Secondary | ICD-10-CM | POA: Insufficient documentation

## 2018-03-08 DIAGNOSIS — R1030 Lower abdominal pain, unspecified: Secondary | ICD-10-CM | POA: Diagnosis present

## 2018-03-08 DIAGNOSIS — F17210 Nicotine dependence, cigarettes, uncomplicated: Secondary | ICD-10-CM | POA: Diagnosis not present

## 2018-03-08 LAB — URINALYSIS, ROUTINE W REFLEX MICROSCOPIC
Bilirubin Urine: NEGATIVE
Glucose, UA: NEGATIVE mg/dL
Hgb urine dipstick: NEGATIVE
Ketones, ur: 80 mg/dL — AB
Leukocytes, UA: NEGATIVE
Nitrite: NEGATIVE
Protein, ur: 30 mg/dL — AB
Specific Gravity, Urine: 1.029 (ref 1.005–1.030)
pH: 5 (ref 5.0–8.0)

## 2018-03-08 LAB — COMPREHENSIVE METABOLIC PANEL
ALT: 17 U/L (ref 14–54)
AST: 18 U/L (ref 15–41)
Albumin: 4.2 g/dL (ref 3.5–5.0)
Alkaline Phosphatase: 67 U/L (ref 38–126)
Anion gap: 10 (ref 5–15)
BUN: 8 mg/dL (ref 6–20)
CO2: 22 mmol/L (ref 22–32)
Calcium: 9.3 mg/dL (ref 8.9–10.3)
Chloride: 103 mmol/L (ref 101–111)
Creatinine, Ser: 0.85 mg/dL (ref 0.44–1.00)
GFR calc Af Amer: >60 mL/min (ref >60)
GFR calc non Af Amer: >60 mL/min (ref >60)
Glucose, Bld: 105 mg/dL — ABNORMAL HIGH (ref 65–99)
Potassium: 3.6 mmol/L (ref 3.5–5.1)
Sodium: 135 mmol/L (ref 135–145)
Total Bilirubin: 1 mg/dL (ref 0.3–1.2)
Total Protein: 8 g/dL (ref 6.5–8.1)

## 2018-03-08 LAB — CBC WITH DIFFERENTIAL/PLATELET
Basophils Absolute: 0 10*3/uL (ref 0.0–0.1)
Basophils Relative: 0 %
Eosinophils Absolute: 0 10*3/uL (ref 0.0–0.7)
Eosinophils Relative: 0 %
HCT: 40.6 % (ref 36.0–46.0)
Hemoglobin: 13.4 g/dL (ref 12.0–15.0)
Lymphocytes Relative: 9 %
Lymphs Abs: 2 10*3/uL (ref 0.7–4.0)
MCH: 30.5 pg (ref 26.0–34.0)
MCHC: 33 g/dL (ref 30.0–36.0)
MCV: 92.5 fL (ref 78.0–100.0)
Monocytes Absolute: 2.1 10*3/uL — ABNORMAL HIGH (ref 0.1–1.0)
Monocytes Relative: 9 %
Neutro Abs: 18.2 10*3/uL — ABNORMAL HIGH (ref 1.7–7.7)
Neutrophils Relative %: 82 %
Platelets: 313 10*3/uL (ref 150–400)
RBC: 4.39 MIL/uL (ref 3.87–5.11)
RDW: 14.9 % (ref 11.5–15.5)
WBC: 22.4 10*3/uL — ABNORMAL HIGH (ref 4.0–10.5)

## 2018-03-08 LAB — WET PREP, GENITAL
Sperm: NONE SEEN
Trich, Wet Prep: NONE SEEN
Yeast Wet Prep HPF POC: NONE SEEN

## 2018-03-08 LAB — PREGNANCY, URINE: Preg Test, Ur: NEGATIVE

## 2018-03-08 LAB — LIPASE, BLOOD: Lipase: 26 U/L (ref 11–51)

## 2018-03-08 MED ORDER — DOXYCYCLINE HYCLATE 100 MG PO CAPS: 100.0000 mg | ORAL_CAPSULE | Freq: Two times a day (BID) | ORAL | 0 refills | Status: DC

## 2018-03-08 MED ORDER — DOXYCYCLINE HYCLATE 100 MG PO TABS
100.0000 mg | ORAL_TABLET | Freq: Once | ORAL | Status: AC
  Administered 2018-03-08: 100 mg via ORAL
  Filled 2018-03-08: qty 1

## 2018-03-08 MED ORDER — METRONIDAZOLE 500 MG PO TABS
500.0000 mg | ORAL_TABLET | Freq: Once | ORAL | Status: AC
  Administered 2018-03-08: 500 mg via ORAL
  Filled 2018-03-08: qty 1

## 2018-03-08 MED ORDER — IOPAMIDOL (ISOVUE-300) INJECTION 61%
100.0000 mL | Freq: Once | INTRAVENOUS | Status: AC | PRN
  Administered 2018-03-08: 100 mL via INTRAVENOUS

## 2018-03-08 MED ORDER — METRONIDAZOLE 500 MG PO TABS: 500.0000 mg | ORAL_TABLET | Freq: Two times a day (BID) | ORAL | 0 refills | Status: DC

## 2018-03-08 MED ORDER — ONDANSETRON HCL 4 MG PO TABS: 4.0000 mg | ORAL_TABLET | Freq: Four times a day (QID) | ORAL | 0 refills | Status: DC

## 2018-03-08 MED ORDER — KETOROLAC TROMETHAMINE 30 MG/ML IJ SOLN
30.0000 mg | Freq: Once | INTRAMUSCULAR | Status: AC
  Administered 2018-03-08: 30 mg via INTRAVENOUS
  Filled 2018-03-08: qty 1

## 2018-03-08 MED ORDER — SODIUM CHLORIDE 0.9 % IV BOLUS
1000.0000 mL | Freq: Once | INTRAVENOUS | Status: AC
  Administered 2018-03-08: 1000 mL via INTRAVENOUS

## 2018-03-08 MED ORDER — HYDROCODONE-ACETAMINOPHEN 5-325 MG PO TABS: 1.0000 | ORAL_TABLET | Freq: Four times a day (QID) | ORAL | 0 refills | Status: DC | PRN

## 2018-03-08 MED ORDER — IOPAMIDOL (ISOVUE-300) INJECTION 61%
INTRAVENOUS | Status: AC
  Filled 2018-03-08: qty 100

## 2018-03-08 MED ORDER — CEFTRIAXONE SODIUM 250 MG IJ SOLR
250.0000 mg | Freq: Once | INTRAMUSCULAR | Status: AC
  Administered 2018-03-08: 250 mg via INTRAMUSCULAR
  Filled 2018-03-08: qty 250

## 2018-03-08 NOTE — ED Notes (Signed)
Bed: WTR7 Expected date:  Expected time:  Means of arrival:  Comments: 

## 2018-03-08 NOTE — ED Provider Notes (Signed)
[facility]-EMERGENCY DEPT Provider Note   CSN: [id number] Arrival date & time: 03/08/18  4098     History   Chief Complaint No chief complaint on file.   HPI Colleen Walls is a 37 y.o. female6 with history of asthma, HIV who presents with a 1 day history of lower abdominal pain.  Her pain is constant, worse with movement.  She describes it as sharp.  It is all across the bottom of her abdomen.  She denies any nausea, vomiting, diarrhea.  She denies any urinary symptoms, abnormal vaginal bleeding or discharge.  She has no concern for STD exposure.  She reports she is not really sexually active.  She is not on birth control.  She denies any back pain, chest pain, shortness of breath.  Patient took a laxative thinking her pain was gas, but did not have any relief.    HPI  Past Medical History:  Diagnosis Date  . Abnormal Pap smear   . Asthma     Patient Active Problem List   Diagnosis Date Noted  . ASCUS with positive high risk HPV 09/17/2012  . HIV (human immunodeficiency virus infection) (HCC) 06/12/2012  . Boil 06/12/2012  . Asthma 06/12/2012    History reviewed. No pertinent surgical history.   OB History    Gravida  3   Para  3   Term  3   Preterm      AB      Living  3     SAB      TAB      Ectopic      Multiple      Live Births               Home Medications    Prior to Admission medications   Medication Sig Start Date End Date Taking? Authorizing Provider  elvitegravir-cobicistat-emtricitabine-tenofovir (GENVOYA) 150-150-200-10 MG TABS tablet Take 1 tablet by mouth daily with breakfast. 03/23/17  Yes Judyann Munson, MD  PROVENTIL HFA 108 [phone] Base) MCG/ACT inhaler INHALE 2 PUFFS INTO THE LUNGS EVERY 6 HOURS AS NEEDED FOR SHORTNESS OF BREATH OR WHEEZING 11/23/17  Yes Judyann Munson, MD  doxycycline (VIBRAMYCIN) 100 MG capsule Take 1 capsule (100 mg total) by mouth 2 (two) times daily. 03/08/18   Matalynn Graff, Waylan Boga, PA-C    HYDROcodone-acetaminophen (NORCO/VICODIN) 5-325 MG tablet Take 1-2 tablets by mouth every 6 (six) hours as needed. 03/08/18   Brecklynn Jian, Waylan Boga, PA-C  metroNIDAZOLE (FLAGYL) 500 MG tablet Take 1 tablet (500 mg total) by mouth 2 (two) times daily. 03/08/18   Benjimen Kelley, Waylan Boga, PA-C  ondansetron (ZOFRAN) 4 MG tablet Take 1 tablet (4 mg total) by mouth every 6 (six) hours. 03/08/18   Emi Holes, PA-C    Family History History reviewed. No pertinent family history.  Social History Social History   Tobacco Use  . Smoking status: Current Every Day Smoker    Packs/day: 0.50    Years: 5.00    Pack years: 2.50    Types: Cigarettes  . Smokeless tobacco: Never Used  . Tobacco comment: setting "quit" date for March; interested in opiton  Substance Use Topics  . Alcohol use: Yes    Comment: occasional  . Drug use: No     Allergies   Patient has no known allergies.   Review of Systems Review of Systems  Constitutional: Negative for chills and fever.  HENT: Negative for facial swelling and sore throat.   Respiratory: Negative for  shortness of breath.   Cardiovascular: Negative for chest pain.  Gastrointestinal: Positive for abdominal pain. Negative for blood in stool, constipation, diarrhea, nausea and vomiting.  Genitourinary: Negative for dysuria, flank pain, vaginal bleeding and vaginal discharge.  Musculoskeletal: Negative for back pain.  Skin: Negative for rash and wound.  Neurological: Negative for headaches.  Psychiatric/Behavioral: The patient is not nervous/anxious.      Physical Exam Updated Vital Signs BP 98/68   Pulse 77   Temp 98.8 F (37.1 C) (Oral)   Resp 16   LMP 02/28/2018   SpO2 100%   Physical Exam  Constitutional: She appears well-developed and well-nourished. No distress.  HENT:  Head: Normocephalic and atraumatic.  Mouth/Throat: Oropharynx is clear and moist. No oropharyngeal exudate.  Eyes: Pupils are equal, round, and reactive to light.  Conjunctivae are normal. Right eye exhibits no discharge. Left eye exhibits no discharge. No scleral icterus.  Neck: Normal range of motion. Neck supple. No thyromegaly present.  Cardiovascular: Normal rate, regular rhythm, normal heart sounds and intact distal pulses. Exam reveals no gallop and no friction rub.  No murmur heard. Pulmonary/Chest: Effort normal and breath sounds normal. No stridor. No respiratory distress. She has no wheezes. She has no rales.  Abdominal: Soft. Bowel sounds are normal. She exhibits no distension. There is tenderness in the right lower quadrant, epigastric area, suprapubic area and left lower quadrant. There is no rebound, no guarding and no CVA tenderness.  Genitourinary: Pelvic exam was performed with patient supine. There is no rash, tenderness or lesion on the right labia. There is no rash, tenderness or lesion on the left labia. Uterus is tender. Cervix exhibits motion tenderness. Right adnexum displays tenderness. Left adnexum displays no tenderness. No bleeding in the vagina. Vaginal discharge (white, could be physiologic) found.  Musculoskeletal: She exhibits no edema.  Lymphadenopathy:    She has no cervical adenopathy.  Neurological: She is alert. Coordination normal.  Skin: Skin is warm and dry. No rash noted. She is not diaphoretic. No pallor.  Psychiatric: She has a normal mood and affect.  Nursing note and vitals reviewed.    ED Treatments / Results  Labs (all labs ordered are listed, but only abnormal results are displayed) Labs Reviewed  WET PREP, GENITAL - Abnormal; Notable for the following components:      Result Value   Clue Cells Wet Prep HPF POC PRESENT (*)    WBC, Wet Prep HPF POC MODERATE (*)    All other components within normal limits  COMPREHENSIVE METABOLIC PANEL - Abnormal; Notable for the following components:   Glucose, Bld 105 (*)    All other components within normal limits  CBC WITH DIFFERENTIAL/PLATELET - Abnormal;  Notable for the following components:   WBC 22.4 (*)    Neutro Abs 18.2 (*)    Monocytes Absolute 2.1 (*)    All other components within normal limits  URINALYSIS, ROUTINE W REFLEX MICROSCOPIC - Abnormal; Notable for the following components:   Color, Urine AMBER (*)    APPearance HAZY (*)    Ketones, ur 80 (*)    Protein, ur 30 (*)    Bacteria, UA RARE (*)    All other components within normal limits  URINE CULTURE  LIPASE, BLOOD  PREGNANCY, URINE  GC/CHLAMYDIA PROBE AMP ([place]) NOT AT [facility]    EKG None  Radiology Ct Abdomen Pelvis W Contrast  Result Date: 03/08/2018 CLINICAL DATA:  Lower abdominal pain for 2 days EXAM: CT ABDOMEN AND PELVIS  WITH CONTRAST TECHNIQUE: Multidetector CT imaging of the abdomen and pelvis was performed using the standard protocol following bolus administration of intravenous contrast. CONTRAST:  ISOVUE-300 IOPAMIDOL (ISOVUE-300) INJECTION 61% COMPARISON:  04/20/2012 FINDINGS: Lower chest: No acute abnormality. Hepatobiliary: No focal liver abnormality is seen. No gallstones, gallbladder wall thickening, or biliary dilatation. Pancreas: Unremarkable. No pancreatic ductal dilatation or surrounding inflammatory changes. Spleen: Normal in size without focal abnormality. Adrenals/Urinary Tract: Adrenal glands are within normal limits. A left renal cyst is noted better visualized on the current exam due to the contrast administration. No renal calculi or obstructive changes are seen. The bladder is decompressed. Stomach/Bowel: Stomach is within normal limits. Appendix appears normal. No evidence of bowel wall thickening, distention, or inflammatory changes. Vascular/Lymphatic: No significant vascular findings are present. No enlarged abdominal or pelvic lymph nodes. Reproductive: Uterus is well visualized and within normal limits. There is a complex cystic lesion arising from the left adnexa and extending towards the midline which is enlarged when compared  with the prior exam and measures at least 5.9 by 4.5 cm. It has some tubular appearance suggesting a pyosalpinx and pelvic inflammatory disease Other: No abdominal wall hernia or abnormality. No abdominopelvic ascites. Musculoskeletal: No acute or significant osseous findings. IMPRESSION: Complex cystic appearing lesion arising from the left adnexa likely representing pelvic inflammatory disease in a pyosalpinx. This would correspond with the elevated white blood cell count. Follow-up ultrasound following appropriate therapy is recommended to assess for resolution. Left renal cyst. Electronically Signed   By: Alcide Clever M.D.   On: 03/08/2018 10:05    Procedures Procedures (including critical care time)  Medications Ordered in ED Medications  iopamidol (ISOVUE-300) 61 % injection (has no administration in time range)  ketorolac (TORADOL) 30 MG/ML injection 30 mg (30 mg Intravenous Given 03/08/18 0740)  sodium chloride 0.9 % bolus 1,000 mL (0 mLs Intravenous Stopped 03/08/18 1136)  iopamidol (ISOVUE-300) 61 % injection 100 mL (100 mLs Intravenous Contrast Given 03/08/18 0914)  metroNIDAZOLE (FLAGYL) tablet 500 mg (500 mg Oral Given 03/08/18 1139)  doxycycline (VIBRA-TABS) tablet 100 mg (100 mg Oral Given 03/08/18 1139)  cefTRIAXone (ROCEPHIN) injection 250 mg (250 mg Intramuscular Given 03/08/18 1139)     Initial Impression / Assessment and Plan / ED Course  I have reviewed the triage vital signs and the nursing notes.  Pertinent labs & imaging results that were available during my care of the patient were reviewed by me and considered in my medical decision making (see chart for details).     Patient with suspected pelvic inflammatory disease.  Leukocytosis, 22.4.  UA shows ketones 80, protein 30, but no infection.  Lipase 26.  CMP unremarkable.  Patient has right adnexal tenderness.  CT abdomen pelvis shows: Complex cystic appearing lesion arising from the left adnexa likely representing pelvic  inflammatory disease in a pyosalpinx. This would correspond with the elevated white blood cell count. Follow-up ultrasound following appropriate therapy is recommended to assess for resolution. Left renal cyst.  Patient is feeling better after Toradol and fluids.  Patient has been afebrile.  Will discharge home with doxycycline and Flagyl.  Patient also discharged home with short course of Norco for pain control, and Zofran for nausea vomiting (if it develops).  I reviewed the [place] narcotic database and found no discrepancies.  Patient given Rocephin IM in the ED.  Close follow-up to OB/GYN advised for repeat ultrasound.  Return precautions discussed.  Patient understands and agrees with plan.  Patient also evaluated by  Dr. Freida Busman who guided the patient's management and agrees with plan.   Final Clinical Impressions(s) / ED Diagnoses   Final diagnoses:  PID (pelvic inflammatory disease)  Bacterial vaginosis    ED Discharge Orders        Ordered    doxycycline (VIBRAMYCIN) 100 MG capsule  2 times daily,   Status:  Discontinued     03/08/18 1123    metroNIDAZOLE (FLAGYL) 500 MG tablet  2 times daily,   Status:  Discontinued     03/08/18 1123    ondansetron (ZOFRAN) 4 MG tablet  Every 6 hours,   Status:  Discontinued     03/08/18 1123    HYDROcodone-acetaminophen (NORCO/VICODIN) 5-325 MG tablet  Every 6 hours PRN,   Status:  Discontinued     03/08/18 1123    doxycycline (VIBRAMYCIN) 100 MG capsule  2 times daily     03/08/18 1132    HYDROcodone-acetaminophen (NORCO/VICODIN) 5-325 MG tablet  Every 6 hours PRN     03/08/18 1132    metroNIDAZOLE (FLAGYL) 500 MG tablet  2 times daily     03/08/18 1132    ondansetron (ZOFRAN) 4 MG tablet  Every 6 hours     03/08/18 1132       Emi Holes, PA-C 03/08/18 1442    Lorre Nick, MD 03/09/18 1733

## 2018-03-08 NOTE — ED Provider Notes (Signed)
Medical screening examination/treatment/procedure(s) were conducted as a shared visit with non-physician practitioner(s) and myself.  I personally evaluated the patient during the encounter.  None 37 year old female here with bilateral lower abdominal pain x24 hours.  No vaginal bleeding or discharge.  On exam she is tender to palpation her left and right lower quadrants.  Has significant leukocytosis on her CBC.  Will order abdominal CT.   Lorre Nick, MD 03/08/18 [phone]

## 2018-03-08 NOTE — ED Triage Notes (Signed)
Pt complains of lower abdominal pain for two days, no vomiting or diarrhea, pt states she took a laxative with normal results

## 2018-03-08 NOTE — Discharge Instructions (Addendum)
Medications: Flagyl, doxycycline, Norco, Zofran  Treatment: Take Flagyl and doxycycline until completed.  Take 1-2 Norco every 6 hours as needed for severe pain. Take Zofran every 6 hours as needed for nausea vomiting. You can talk to your infectious disease doctor about taking NSAID medication such as ibuprofen or Aleve in addition to Norco in combination with your Genvoya.  Do not drink alcohol, drive, operate machinery or participate in any other potentially dangerous activities while taking opiate pain medication as it may make you sleepy. Do not take this medication with any other sedating medications, either prescription or over-the-counter. If you were prescribed Percocet or Vicodin, do not take these with acetaminophen (Tylenol) as it is already contained within these medications and overdose of Tylenol is dangerous.   This medication is an opiate (or narcotic) pain medication and can be habit forming.  Use it as little as possible to achieve adequate pain control.  Do not use or use it with extreme caution if you have a history of opiate abuse or dependence. This medication is intended for your use only - do not give any to anyone else and keep it in a secure place where nobody else, especially children, have access to it. It will also cause or worsen constipation, so you may want to consider taking an over-the-counter stool softener while you are taking this medication.  Follow-up: Please follow-up with your OB/GYN early next week for recheck.  Please return to the emergency department if you develop any new or worsening symptoms.  You will be called in 3 days if you test positive for any STDs.  Please make sure to abstain from intercourse until you have completed treatment. Use condoms in the future to help prevent STD and unwanted pregnancy.

## 2018-03-09 LAB — URINE CULTURE: Culture: NO GROWTH

## 2018-03-09 LAB — GC/CHLAMYDIA PROBE AMP (~~LOC~~) NOT AT ARMC
Chlamydia: NEGATIVE
Neisseria Gonorrhea: NEGATIVE

## 2018-03-28 ENCOUNTER — Other Ambulatory Visit: Payer: Self-pay | Admitting: Family Medicine

## 2018-03-28 ENCOUNTER — Ambulatory Visit: Payer: Self-pay

## 2018-03-28 DIAGNOSIS — M25561 Pain in right knee: Secondary | ICD-10-CM

## 2018-04-25 ENCOUNTER — Ambulatory Visit: Payer: Self-pay

## 2018-05-01 ENCOUNTER — Other Ambulatory Visit: Payer: Self-pay | Admitting: Internal Medicine

## 2018-05-01 DIAGNOSIS — B20 Human immunodeficiency virus [HIV] disease: Secondary | ICD-10-CM

## 2018-05-08 ENCOUNTER — Encounter: Payer: Self-pay | Admitting: Internal Medicine

## 2018-05-14 ENCOUNTER — Other Ambulatory Visit: Payer: Self-pay | Admitting: *Deleted

## 2018-05-14 DIAGNOSIS — B20 Human immunodeficiency virus [HIV] disease: Secondary | ICD-10-CM

## 2018-05-14 MED ORDER — ELVITEG-COBIC-EMTRICIT-TENOFAF 150-150-200-10 MG PO TABS: 1.0000 | ORAL_TABLET | Freq: Every day | ORAL | 1 refills | Status: DC

## 2018-05-14 NOTE — Progress Notes (Signed)
Refills per patient assistance Colleen Walls([employer]). RN left message on patient's mobile phone voicemail asking her to please call for an appointment/be seen for future refills. Andree CossHowell, Yomira Flitton M, RN

## 2018-06-04 ENCOUNTER — Ambulatory Visit (INDEPENDENT_AMBULATORY_CARE_PROVIDER_SITE_OTHER): Payer: Self-pay | Admitting: Internal Medicine

## 2018-06-04 ENCOUNTER — Other Ambulatory Visit: Payer: Self-pay

## 2018-06-04 ENCOUNTER — Encounter: Payer: Self-pay | Admitting: Internal Medicine

## 2018-06-04 VITALS — BP 124/83 | HR 86 | Temp 98.4°F | Ht 63.0 in | Wt 157.0 lb

## 2018-06-04 DIAGNOSIS — Z79899 Other long term (current) drug therapy: Secondary | ICD-10-CM

## 2018-06-04 DIAGNOSIS — B20 Human immunodeficiency virus [HIV] disease: Secondary | ICD-10-CM

## 2018-06-04 DIAGNOSIS — Z23 Encounter for immunization: Secondary | ICD-10-CM

## 2018-06-04 MED ORDER — BICTEGRAVIR-EMTRICITAB-TENOFOV 50-200-25 MG PO TABS: 1.0000 | ORAL_TABLET | Freq: Every day | ORAL | 11 refills | Status: DC

## 2018-06-04 NOTE — Patient Instructions (Signed)
Come back in roughly 6 wk to check viral load   Will see you back in 3 months

## 2018-06-04 NOTE — Progress Notes (Signed)
RFV: follow up for hiv disease  Patient ID: Colleen Walls, female   DOB: [birth year], 37 y.o.   MRN: [id number]  HPI Colleen Walls is [age]yo F with well controlled hiv disease, CD 4 count 890/VL<20 in may. She reports that her health has been good. She has Quit smoking x 3 months. Noticeably feeling better, less DOE and increased exercise tolerance.  Outpatient Encounter Medications as of 06/04/2018  Medication Sig  . elvitegravir-cobicistat-emtricitabine-tenofovir (GENVOYA) 150-150-200-10 MG TABS tablet Take 1 tablet by mouth daily with breakfast.  . PROVENTIL HFA 108 (90 Base) MCG/ACT inhaler INHALE 2 PUFFS INTO THE LUNGS EVERY 6 HOURS AS NEEDED FOR SHORTNESS OF BREATH OR WHEEZING  . [DISCONTINUED] doxycycline (VIBRAMYCIN) 100 MG capsule Take 1 capsule (100 mg total) by mouth 2 (two) times daily.  . [DISCONTINUED] HYDROcodone-acetaminophen (NORCO/VICODIN) 5-325 MG tablet Take 1-2 tablets by mouth every 6 (six) hours as needed.  . [DISCONTINUED] metroNIDAZOLE (FLAGYL) 500 MG tablet Take 1 tablet (500 mg total) by mouth 2 (two) times daily.  . [DISCONTINUED] ondansetron (ZOFRAN) 4 MG tablet Take 1 tablet (4 mg total) by mouth every 6 (six) hours.   No facility-administered encounter medications on file as of 06/04/2018.      Patient Active Problem List   Diagnosis Date Noted  . ASCUS with positive high risk HPV 09/17/2012  . HIV (human immunodeficiency virus infection) (HCC) 06/12/2012  . Boil 06/12/2012  . Asthma 06/12/2012     Health Maintenance Due  Topic Date Due  . INFLUENZA VACCINE  05/10/2018     Review of Systems  Constitutional: Negative for fever, chills, diaphoresis, activity change, appetite change, fatigue and unexpected weight change.  HENT: Negative for congestion, sore throat, rhinorrhea, sneezing, trouble swallowing and sinus pressure.  Eyes: Negative for photophobia and visual disturbance.  Respiratory: Negative for cough, chest tightness, shortness of breath,  wheezing and stridor.  Cardiovascular: Negative for chest pain, palpitations and leg swelling.  Gastrointestinal: Negative for nausea, vomiting, abdominal pain, diarrhea, constipation, blood in stool, abdominal distention and anal bleeding.  Genitourinary: Negative for dysuria, hematuria, flank pain and difficulty urinating.  Musculoskeletal: Negative for myalgias, back pain, joint swelling, arthralgias and gait problem.  Skin: Negative for color change, pallor, rash and wound.  Neurological: Negative for dizziness, tremors, weakness and light-headedness.  Hematological: Negative for adenopathy. Does not bruise/bleed easily.  Psychiatric/Behavioral: Negative for behavioral problems, confusion, sleep disturbance, dysphoric mood, decreased concentration and agitation.    Physical Exam   Ht 5\' 3"  (1.6 m)   Wt 157 lb (71.2 kg)   LMP 04/16/2018 (Approximate)   BMI 27.81 kg/m   Physical Exam  Constitutional:  oriented to person, place, and time. appears well-developed and well-nourished. No distress.  HENT: [place]/AT, PERRLA, no scleral icterus Mouth/Throat: Oropharynx is clear and moist. No oropharyngeal exudate.  Cardiovascular: Normal rate, regular rhythm and normal heart sounds. Exam reveals no gallop and no friction rub.  No murmur heard.  Pulmonary/Chest: Effort normal and breath sounds normal. No respiratory distress.  has no wheezes.  Neck = supple, no nuchal rigidity Abdominal: Soft. Bowel sounds are normal.  exhibits no distension. There is no tenderness.  Lymphadenopathy: no cervical adenopathy. No axillary adenopathy Neurological: alert and oriented to person, place, and time.  Skin: Skin is warm and dry. No rash noted. No erythema.  Psychiatric: a normal mood and affect.  behavior is normal.    Lab Results  Component Value Date   CD4TCELL 26 (L) 02/07/2018   Lab Results  Component Value  Date   CD4TABS 890 02/07/2018   CD4TABS 740 11/23/2017   CD4TABS 900 08/23/2017   Lab  Results  Component Value Date   HIV1RNAQUANT 35 (H) 02/07/2018   Lab Results  Component Value Date   HEPBSAB POS (A) 06/12/2012   Lab Results  Component Value Date   LABRPR NON-REACTIVE 11/23/2017    CBC Lab Results  Component Value Date   WBC 22.4 (H) 03/08/2018   RBC 4.39 03/08/2018   HGB 13.4 03/08/2018   HCT 40.6 03/08/2018   PLT 313 03/08/2018   MCV 92.5 03/08/2018   MCH 30.5 03/08/2018   MCHC 33.0 03/08/2018   RDW 14.9 03/08/2018   LYMPHSABS 2.0 03/08/2018   MONOABS 2.1 (H) 03/08/2018   EOSABS 0.0 03/08/2018    BMET Lab Results  Component Value Date   NA 135 03/08/2018   K 3.6 03/08/2018   CL 103 03/08/2018   CO2 22 03/08/2018   GLUCOSE 105 (H) 03/08/2018   BUN 8 03/08/2018   CREATININE 0.85 03/08/2018   CALCIUM 9.3 03/08/2018   GFRNONAA >60 03/08/2018   GFRAA >60 03/08/2018      Assessment and Plan  hiv disease= did have issues with adap. Now resolved. Has been on genvoya x 4 wk. Will check viral load. Will switch to biktarvy since would like smaller pill burden  Long term medication monitoring = cr stable. Also will no longer be on cobicistat with changing over to biktarvy.  Smoking cessation = x 3 months now smoke free. Continue with great work  Health maintenance = menveo #2, and prevnar 13 today. Flu vaccine in the coming month

## 2018-06-05 LAB — T-HELPER CELL (CD4) - (RCID CLINIC ONLY)
CD4 T CELL HELPER: 26 % — AB (ref 33–55)
CD4 T Cell Abs: 740 /uL (ref 400–2700)

## 2018-06-06 LAB — COMPLETE METABOLIC PANEL WITH GFR
AG Ratio: 1.5 (calc) (ref 1.0–2.5)
ALKALINE PHOSPHATASE (APISO): 62 U/L (ref 33–115)
ALT: 13 U/L (ref 6–29)
AST: 19 U/L (ref 10–30)
Albumin: 4.1 g/dL (ref 3.6–5.1)
BILIRUBIN TOTAL: 0.4 mg/dL (ref 0.2–1.2)
BUN: 10 mg/dL (ref 7–25)
CHLORIDE: 109 mmol/L (ref 98–110)
CO2: 24 mmol/L (ref 20–32)
Calcium: 9.3 mg/dL (ref 8.6–10.2)
Creat: 0.99 mg/dL (ref 0.50–1.10)
GFR, EST AFRICAN AMERICAN: 84 mL/min/{1.73_m2} (ref >=60)
GFR, Est Non African American: 72 mL/min/{1.73_m2} (ref >=60)
GLUCOSE: 83 mg/dL (ref 65–99)
Globulin: 2.7 g/dL (calc) (ref 1.9–3.7)
Potassium: 4.5 mmol/L (ref 3.5–5.3)
Sodium: 139 mmol/L (ref 135–146)
TOTAL PROTEIN: 6.8 g/dL (ref 6.1–8.1)

## 2018-06-06 LAB — CBC WITH DIFFERENTIAL/PLATELET
BASOS PCT: 0.4 %
Basophils Absolute: 27 cells/uL (ref 0–200)
EOS ABS: 122 {cells}/uL (ref 15–500)
Eosinophils Relative: 1.8 %
HCT: 37.8 % (ref 35.0–45.0)
Hemoglobin: 12.7 g/dL (ref 11.7–15.5)
Lymphs Abs: 2693 cells/uL (ref 850–3900)
MCH: 30.4 pg (ref 27.0–33.0)
MCHC: 33.6 g/dL (ref 32.0–36.0)
MCV: 90.4 fL (ref 80.0–100.0)
MONOS PCT: 11.5 %
MPV: 10 fL (ref 7.5–12.5)
Neutro Abs: 3176 cells/uL (ref 1500–7800)
Neutrophils Relative %: 46.7 %
Platelets: 320 10*3/uL (ref 140–400)
RBC: 4.18 10*6/uL (ref 3.80–5.10)
RDW: 13.7 % (ref 11.0–15.0)
Total Lymphocyte: 39.6 %
WBC mixed population: 782 cells/uL (ref 200–950)
WBC: 6.8 10*3/uL (ref 3.8–10.8)

## 2018-06-06 LAB — LIPID PANEL
CHOLESTEROL: 164 mg/dL (ref <200)
HDL: 60 mg/dL (ref >50)
LDL CHOLESTEROL (CALC): 86 mg/dL
Non-HDL Cholesterol (Calc): 104 mg/dL (calc) (ref <130)
TRIGLYCERIDES: 87 mg/dL (ref <150)
Total CHOL/HDL Ratio: 2.7 (calc) (ref <5.0)

## 2018-06-06 LAB — HIV-1 RNA QUANT-NO REFLEX-BLD
HIV 1 RNA Quant: <20 copies/mL
HIV-1 RNA Quant, Log: <1.3 Log copies/mL

## 2018-07-16 ENCOUNTER — Other Ambulatory Visit: Payer: Medicaid Other

## 2018-07-16 DIAGNOSIS — B20 Human immunodeficiency virus [HIV] disease: Secondary | ICD-10-CM

## 2018-07-18 LAB — HIV-1 RNA QUANT-NO REFLEX-BLD
HIV 1 RNA Quant: <20 copies/mL — AB
HIV-1 RNA Quant, Log: <1.3 Log copies/mL — AB

## 2018-09-10 ENCOUNTER — Ambulatory Visit: Payer: Self-pay | Admitting: Internal Medicine

## 2018-11-28 ENCOUNTER — Other Ambulatory Visit: Payer: Self-pay | Admitting: *Deleted

## 2018-11-28 DIAGNOSIS — Z79899 Other long term (current) drug therapy: Secondary | ICD-10-CM

## 2018-11-28 DIAGNOSIS — B20 Human immunodeficiency virus [HIV] disease: Secondary | ICD-10-CM

## 2018-11-28 DIAGNOSIS — Z113 Encounter for screening for infections with a predominantly sexual mode of transmission: Secondary | ICD-10-CM

## 2018-12-03 ENCOUNTER — Other Ambulatory Visit (HOSPITAL_COMMUNITY)
Admission: RE | Admit: 2018-12-03 | Discharge: 2018-12-03 | Disposition: A | Discharge status: Home / Self Care | Payer: Medicaid Other | Source: Ambulatory Visit | Attending: Internal Medicine | Admitting: Internal Medicine

## 2018-12-03 ENCOUNTER — Other Ambulatory Visit: Payer: Medicaid Other

## 2018-12-03 DIAGNOSIS — Z113 Encounter for screening for infections with a predominantly sexual mode of transmission: Secondary | ICD-10-CM | POA: Insufficient documentation

## 2018-12-03 DIAGNOSIS — B20 Human immunodeficiency virus [HIV] disease: Secondary | ICD-10-CM | POA: Insufficient documentation

## 2018-12-03 DIAGNOSIS — Z79899 Other long term (current) drug therapy: Secondary | ICD-10-CM

## 2018-12-05 LAB — CBC WITH DIFFERENTIAL/PLATELET
ABSOLUTE MONOCYTES: 549 {cells}/uL (ref 200–950)
BASOS ABS: 30 {cells}/uL (ref 0–200)
Basophils Relative: 0.5 %
EOS ABS: 130 {cells}/uL (ref 15–500)
Eosinophils Relative: 2.2 %
HCT: 38.6 % (ref 35.0–45.0)
Hemoglobin: 12.7 g/dL (ref 11.7–15.5)
Lymphs Abs: 2873 cells/uL (ref 850–3900)
MCH: 29.9 pg (ref 27.0–33.0)
MCHC: 32.9 g/dL (ref 32.0–36.0)
MCV: 90.8 fL (ref 80.0–100.0)
MONOS PCT: 9.3 %
MPV: 10.3 fL (ref 7.5–12.5)
Neutro Abs: 2319 cells/uL (ref 1500–7800)
Neutrophils Relative %: 39.3 %
PLATELETS: 327 10*3/uL (ref 140–400)
RBC: 4.25 10*6/uL (ref 3.80–5.10)
RDW: 12.8 % (ref 11.0–15.0)
Total Lymphocyte: 48.7 %
WBC: 5.9 10*3/uL (ref 3.8–10.8)

## 2018-12-05 LAB — COMPLETE METABOLIC PANEL WITH GFR
AG Ratio: 1.5 (calc) (ref 1.0–2.5)
ALKALINE PHOSPHATASE (APISO): 74 U/L (ref 31–125)
ALT: 14 U/L (ref 6–29)
AST: 20 U/L (ref 10–30)
Albumin: 4 g/dL (ref 3.6–5.1)
BILIRUBIN TOTAL: 0.3 mg/dL (ref 0.2–1.2)
BUN: 8 mg/dL (ref 7–25)
CO2: 22 mmol/L (ref 20–32)
Calcium: 9.5 mg/dL (ref 8.6–10.2)
Chloride: 108 mmol/L (ref 98–110)
Creat: 0.98 mg/dL (ref 0.50–1.10)
GFR, Est African American: 85 mL/min/{1.73_m2} (ref >=60)
GFR, Est Non African American: 73 mL/min/{1.73_m2} (ref >=60)
Globulin: 2.6 g/dL (calc) (ref 1.9–3.7)
Glucose, Bld: 90 mg/dL (ref 65–99)
Potassium: 4.4 mmol/L (ref 3.5–5.3)
Sodium: 138 mmol/L (ref 135–146)
Total Protein: 6.6 g/dL (ref 6.1–8.1)

## 2018-12-05 LAB — HIV-1 RNA QUANT-NO REFLEX-BLD
HIV 1 RNA Quant: 45 copies/mL — ABNORMAL HIGH
HIV-1 RNA Quant, Log: 1.65 Log copies/mL — ABNORMAL HIGH

## 2018-12-05 LAB — T-HELPER CELL (CD4) - (RCID CLINIC ONLY)
CD4 T CELL ABS: 770 /uL (ref 400–2700)
CD4 T CELL HELPER: 27 % — AB (ref 33–55)

## 2018-12-05 LAB — LIPID PANEL
Cholesterol: 175 mg/dL (ref <200)
HDL: 55 mg/dL (ref >=50)
LDL Cholesterol (Calc): 97 mg/dL (calc)
Non-HDL Cholesterol (Calc): 120 mg/dL (calc) (ref <130)
Total CHOL/HDL Ratio: 3.2 (calc) (ref <5.0)
Triglycerides: 130 mg/dL (ref <150)

## 2018-12-05 LAB — RPR: RPR Ser Ql: NONREACTIVE

## 2018-12-06 LAB — URINE CYTOLOGY ANCILLARY ONLY
Chlamydia: NEGATIVE
Neisseria Gonorrhea: NEGATIVE

## 2018-12-17 ENCOUNTER — Encounter: Payer: Medicaid Other | Admitting: Internal Medicine

## 2018-12-17 ENCOUNTER — Ambulatory Visit (INDEPENDENT_AMBULATORY_CARE_PROVIDER_SITE_OTHER): Payer: Medicaid Other | Admitting: Internal Medicine

## 2018-12-17 ENCOUNTER — Other Ambulatory Visit: Payer: Self-pay

## 2018-12-17 ENCOUNTER — Encounter: Payer: Self-pay | Admitting: Internal Medicine

## 2018-12-17 VITALS — BP 135/84 | HR 93 | Temp 98.6°F | Wt 156.0 lb

## 2018-12-17 DIAGNOSIS — B20 Human immunodeficiency virus [HIV] disease: Secondary | ICD-10-CM

## 2018-12-17 DIAGNOSIS — Z79899 Other long term (current) drug therapy: Secondary | ICD-10-CM

## 2018-12-17 NOTE — Progress Notes (Signed)
RFV: follow up for hiv disease  Patient ID: Colleen Walls, female   DOB: [birth year], 38 y.o.   MRN: [id number]  HPI Colleen Walls is a 39yo F with hiv disease, well controlled, CD 4 count 770/VL 45 on biktarvy. Doing well with adherence. Keeps busy with work and kids. Denies any recent illnesses  Outpatient Encounter Medications as of 12/17/2018  Medication Sig  . bictegravir-emtricitabine-tenofovir AF (BIKTARVY) 50-200-25 MG TABS tablet Take 1 tablet by mouth daily.  Marland Kitchen PROVENTIL HFA 108 (90 Base) MCG/ACT inhaler INHALE 2 PUFFS INTO THE LUNGS EVERY 6 HOURS AS NEEDED FOR SHORTNESS OF BREATH OR WHEEZING   No facility-administered encounter medications on file as of 12/17/2018.      Patient Active Problem List   Diagnosis Date Noted  . ASCUS with positive high risk HPV 09/17/2012  . HIV (human immunodeficiency virus infection) (HCC) 06/12/2012  . Boil 06/12/2012  . Asthma 06/12/2012     Health Maintenance Due  Topic Date Due  . INFLUENZA VACCINE  05/10/2018    Social History   Tobacco Use  . Smoking status: Former Smoker    Packs/day: 0.50    Years: 5.00    Pack years: 2.50    Types: Cigarettes  . Smokeless tobacco: Never Used  . Tobacco comment: setting "quit" date for March; interested in opiton  Substance Use Topics  . Alcohol use: Yes    Comment: occasional  . Drug use: No   Review of Systems Review of Systems  Constitutional: Negative for fever, chills, diaphoresis, activity change, appetite change, fatigue and unexpected weight change.  HENT: Negative for congestion, sore throat, rhinorrhea, sneezing, trouble swallowing and sinus pressure.  Eyes: Negative for photophobia and visual disturbance.  Respiratory: Negative for cough, chest tightness, shortness of breath, wheezing and stridor.  Cardiovascular: Negative for chest pain, palpitations and leg swelling.  Gastrointestinal: Negative for nausea, vomiting, abdominal pain, diarrhea, constipation, blood in stool, abdominal  distention and anal bleeding.  Genitourinary: Negative for dysuria, hematuria, flank pain and difficulty urinating.  Musculoskeletal: Negative for myalgias, back pain, joint swelling, arthralgias and gait problem.  Skin: Negative for color change, pallor, rash and wound.  Neurological: Negative for dizziness, tremors, weakness and light-headedness.  Hematological: Negative for adenopathy. Does not bruise/bleed easily.  Psychiatric/Behavioral: Negative for behavioral problems, confusion, sleep disturbance, dysphoric mood, decreased concentration and agitation.    Physical Exam   BP 135/84   Pulse 93   Temp 98.6 F (37 C) (Oral)   Wt 156 lb (70.8 kg)   LMP 11/23/2018   BMI 27.63 kg/m   Physical Exam  Constitutional:  oriented to person, place, and time. appears well-developed and well-nourished. No distress.  HENT: [place]/AT, PERRLA, no scleral icterus Mouth/Throat: Oropharynx is clear and moist. No oropharyngeal exudate.  Cardiovascular: Normal rate, regular rhythm and normal heart sounds. Exam reveals no gallop and no friction rub.  No murmur heard.  Pulmonary/Chest: Effort normal and breath sounds normal. No respiratory distress.  has no wheezes.  Neck = supple, no nuchal rigidity Lymphadenopathy: no cervical adenopathy. No axillary adenopathy Neurological: alert and oriented to person, place, and time.  Skin: Skin is warm and dry. No rash noted. No erythema.  Psychiatric: a normal mood and affect.  behavior is normal.   Lab Results  Component Value Date   CD4TCELL 27 (L) 12/03/2018   Lab Results  Component Value Date   CD4TABS 770 12/03/2018   CD4TABS 740 06/04/2018   CD4TABS 890 02/07/2018   Lab Results  Component  Value Date   HIV1RNAQUANT 45 (H) 12/03/2018   Lab Results  Component Value Date   HEPBSAB POS (A) 06/12/2012   Lab Results  Component Value Date   LABRPR NON-REACTIVE 12/03/2018    CBC Lab Results  Component Value Date   WBC 5.9 12/03/2018   RBC  4.25 12/03/2018   HGB 12.7 12/03/2018   HCT 38.6 12/03/2018   PLT 327 12/03/2018   MCV 90.8 12/03/2018   MCH 29.9 12/03/2018   MCHC 32.9 12/03/2018   RDW 12.8 12/03/2018   LYMPHSABS 2,873 12/03/2018   MONOABS 2.1 (H) 03/08/2018   EOSABS 130 12/03/2018    BMET Lab Results  Component Value Date   NA 138 12/03/2018   K 4.4 12/03/2018   CL 108 12/03/2018   CO2 22 12/03/2018   GLUCOSE 90 12/03/2018   BUN 8 12/03/2018   CREATININE 0.98 12/03/2018   CALCIUM 9.5 12/03/2018   GFRNONAA 73 12/03/2018   GFRAA 85 12/03/2018      Assessment and Plan  hiv disease = well controlled. Plan to continue on current regimen  Long term medication management = cr appears stable while on this regimen . Continue to montior

## 2019-06-19 ENCOUNTER — Telehealth: Payer: Self-pay | Admitting: Pharmacy Technician

## 2019-06-19 ENCOUNTER — Encounter: Payer: Self-pay | Admitting: Internal Medicine

## 2019-06-19 ENCOUNTER — Ambulatory Visit (INDEPENDENT_AMBULATORY_CARE_PROVIDER_SITE_OTHER): Payer: Self-pay | Admitting: Internal Medicine

## 2019-06-19 ENCOUNTER — Other Ambulatory Visit: Payer: Self-pay

## 2019-06-19 VITALS — BP 132/84 | HR 97 | Temp 98.1°F

## 2019-06-19 DIAGNOSIS — Z79899 Other long term (current) drug therapy: Secondary | ICD-10-CM

## 2019-06-19 DIAGNOSIS — B20 Human immunodeficiency virus [HIV] disease: Secondary | ICD-10-CM

## 2019-06-19 DIAGNOSIS — Z23 Encounter for immunization: Secondary | ICD-10-CM

## 2019-06-19 NOTE — Telephone Encounter (Signed)
[facility] Patient Advocate Encounter  Completed and sent [employer] Advancing Access application for [place] for this patient who is uninsured.    Patient is approved 06/19/2019 through 07/19/2019.  This will bridge her for 30 days until HMAP is approved.  BIN      [id number] PCN    [id number] GRP    [id number] ID        [id number]   Colleen Walls [place] Patient [facility] Phone: [phone] Fax:  [phone]

## 2019-06-20 LAB — T-HELPER CELL (CD4) - (RCID CLINIC ONLY)
CD4 % Helper T Cell: 31 % — ABNORMAL LOW (ref 33–65)
CD4 T Cell Abs: 897 /uL (ref 400–1790)

## 2019-06-22 LAB — COMPLETE METABOLIC PANEL WITH GFR
AG Ratio: 1.5 (calc) (ref 1.0–2.5)
ALT: 18 U/L (ref 6–29)
AST: 22 U/L (ref 10–30)
Albumin: 4 g/dL (ref 3.6–5.1)
Alkaline phosphatase (APISO): 67 U/L (ref 31–125)
BUN/Creatinine Ratio: 6 (calc) (ref 6–22)
BUN: 6 mg/dL — ABNORMAL LOW (ref 7–25)
CO2: 24 mmol/L (ref 20–32)
Calcium: 9.2 mg/dL (ref 8.6–10.2)
Chloride: 108 mmol/L (ref 98–110)
Creat: 0.95 mg/dL (ref 0.50–1.10)
GFR, Est African American: 87 mL/min/{1.73_m2} (ref >=60)
GFR, Est Non African American: 75 mL/min/{1.73_m2} (ref >=60)
Globulin: 2.7 g/dL (calc) (ref 1.9–3.7)
Glucose, Bld: 79 mg/dL (ref 65–99)
Potassium: 4.4 mmol/L (ref 3.5–5.3)
Sodium: 138 mmol/L (ref 135–146)
Total Bilirubin: 0.5 mg/dL (ref 0.2–1.2)
Total Protein: 6.7 g/dL (ref 6.1–8.1)

## 2019-06-22 LAB — CBC WITH DIFFERENTIAL/PLATELET
Absolute Monocytes: 704 cells/uL (ref 200–950)
Basophils Absolute: 41 cells/uL (ref 0–200)
Basophils Relative: 0.6 %
Eosinophils Absolute: 131 cells/uL (ref 15–500)
Eosinophils Relative: 1.9 %
HCT: 36.6 % (ref 35.0–45.0)
Hemoglobin: 12.3 g/dL (ref 11.7–15.5)
Lymphs Abs: 3271 cells/uL (ref 850–3900)
MCH: 30.4 pg (ref 27.0–33.0)
MCHC: 33.6 g/dL (ref 32.0–36.0)
MCV: 90.6 fL (ref 80.0–100.0)
MPV: 10 fL (ref 7.5–12.5)
Monocytes Relative: 10.2 %
Neutro Abs: 2753 cells/uL (ref 1500–7800)
Neutrophils Relative %: 39.9 %
Platelets: 326 10*3/uL (ref 140–400)
RBC: 4.04 10*6/uL (ref 3.80–5.10)
RDW: 12.6 % (ref 11.0–15.0)
Total Lymphocyte: 47.4 %
WBC: 6.9 10*3/uL (ref 3.8–10.8)

## 2019-06-22 LAB — LIPID PANEL
Cholesterol: 176 mg/dL (ref <200)
HDL: 51 mg/dL (ref >=50)
LDL Cholesterol (Calc): 103 mg/dL (calc) — ABNORMAL HIGH
Non-HDL Cholesterol (Calc): 125 mg/dL (calc) (ref <130)
Total CHOL/HDL Ratio: 3.5 (calc) (ref <5.0)
Triglycerides: 128 mg/dL (ref <150)

## 2019-06-22 LAB — HIV-1 RNA QUANT-NO REFLEX-BLD
HIV 1 RNA Quant: <20 copies/mL — AB
HIV-1 RNA Quant, Log: <1.3 Log copies/mL — AB

## 2019-06-22 LAB — RPR: RPR Ser Ql: NONREACTIVE

## 2019-06-25 ENCOUNTER — Other Ambulatory Visit: Payer: Self-pay | Admitting: Internal Medicine

## 2019-06-26 ENCOUNTER — Other Ambulatory Visit: Payer: Self-pay | Admitting: *Deleted

## 2019-06-27 NOTE — Progress Notes (Signed)
RFV: follow up for hiv disease  Patient ID: Colleen GlowNicole Kreps, female   DOB: 01-11-1981, 38 y.o.   MRN: [id number]  HPI 38yo F with hiv disease, CD 4 count 897/VL<20, on biktarvy. Denies any recent illnesses. Continues to wear mask with outdoors. Kids are doing well/no close contact to covid-19  Outpatient Encounter Medications as of 06/19/2019  Medication Sig  . PROVENTIL HFA 108 (90 Base) MCG/ACT inhaler INHALE 2 PUFFS INTO THE LUNGS EVERY 6 HOURS AS NEEDED FOR SHORTNESS OF BREATH OR WHEEZING  . [DISCONTINUED] bictegravir-emtricitabine-tenofovir AF (BIKTARVY) 50-200-25 MG TABS tablet Take 1 tablet by mouth daily.   No facility-administered encounter medications on file as of 06/19/2019.      Patient Active Problem List   Diagnosis Date Noted  . ASCUS with positive high risk HPV 09/17/2012  . HIV (human immunodeficiency virus infection) (HCC) 06/12/2012  . Boil 06/12/2012  . Asthma 06/12/2012     Health Maintenance Due  Topic Date Due  . PAP SMEAR-Modifier  02/26/2019     Review of Systems  Constitutional: Negative for fever, chills, diaphoresis, activity change, appetite change, fatigue and unexpected weight change.  HENT: Negative for congestion, sore throat, rhinorrhea, sneezing, trouble swallowing and sinus pressure.  Eyes: Negative for photophobia and visual disturbance.  Respiratory: Negative for cough, chest tightness, shortness of breath, wheezing and stridor.  Cardiovascular: Negative for chest pain, palpitations and leg swelling.  Gastrointestinal: Negative for nausea, vomiting, abdominal pain, diarrhea, constipation, blood in stool, abdominal distention and anal bleeding.  Genitourinary: Negative for dysuria, hematuria, flank pain and difficulty urinating.  Musculoskeletal: Negative for myalgias, back pain, joint swelling, arthralgias and gait problem.  Skin: Negative for color change, pallor, rash and wound.  Neurological: Negative for dizziness, tremors, weakness and  light-headedness.  Hematological: Negative for adenopathy. Does not bruise/bleed easily.  Psychiatric/Behavioral: Negative for behavioral problems, confusion, sleep disturbance, dysphoric mood, decreased concentration and agitation.    Physical Exam   BP 132/84   Pulse 97   Temp 98.1 F (36.7 C)   Physical Exam  Constitutional:  oriented to person, place, and time. appears well-developed and well-nourished. No distress.  HENT: [place]/AT, PERRLA, no scleral icterus Mouth/Throat: Oropharynx is clear and moist. No oropharyngeal exudate.  Cardiovascular: Normal rate, regular rhythm and normal heart sounds. Exam reveals no gallop and no friction rub.  No murmur heard.  Pulmonary/Chest: Effort normal and breath sounds normal. No respiratory distress.  has no wheezes.  Neck = supple, no nuchal rigidity Abdominal: Soft. Bowel sounds are normal.  exhibits no distension. There is no tenderness.  Lymphadenopathy: no cervical adenopathy. No axillary adenopathy Neurological: alert and oriented to person, place, and time.  Skin: Skin is warm and dry. No rash noted. No erythema.  Psychiatric: a normal mood and affect.  behavior is normal.   Lab Results  Component Value Date   CD4TCELL 31 (L) 06/19/2019   Lab Results  Component Value Date   CD4TABS 897 06/19/2019   CD4TABS 770 12/03/2018   CD4TABS 740 06/04/2018   Lab Results  Component Value Date   HIV1RNAQUANT <20 DETECTED (A) 06/19/2019   Lab Results  Component Value Date   HEPBSAB POS (A) 06/12/2012   Lab Results  Component Value Date   LABRPR NON-REACTIVE 06/19/2019    CBC Lab Results  Component Value Date   WBC 6.9 06/19/2019   RBC 4.04 06/19/2019   HGB 12.3 06/19/2019   HCT 36.6 06/19/2019   PLT 326 06/19/2019   MCV 90.6 06/19/2019  MCH 30.4 06/19/2019   MCHC 33.6 06/19/2019   RDW 12.6 06/19/2019   LYMPHSABS 3,271 06/19/2019   MONOABS 2.1 (H) 03/08/2018   EOSABS 131 06/19/2019    BMET Lab Results  Component  Value Date   NA 138 06/19/2019   K 4.4 06/19/2019   CL 108 06/19/2019   CO2 24 06/19/2019   GLUCOSE 79 06/19/2019   BUN 6 (L) 06/19/2019   CREATININE 0.95 06/19/2019   CALCIUM 9.2 06/19/2019   GFRNONAA 75 06/19/2019   GFRAA 87 06/19/2019      Assessment and Plan  hiv disease = continue on biktarvy. Well controlled  Long term medication management = cr is stable. No need to change in regimen.  Health maintenance =will give flu shot

## 2019-12-17 ENCOUNTER — Other Ambulatory Visit: Payer: Self-pay

## 2019-12-17 ENCOUNTER — Other Ambulatory Visit: Payer: Self-pay | Admitting: *Deleted

## 2019-12-17 DIAGNOSIS — B20 Human immunodeficiency virus [HIV] disease: Secondary | ICD-10-CM

## 2019-12-18 LAB — T-HELPER CELL (CD4) - (RCID CLINIC ONLY)
CD4 % Helper T Cell: 30 % — ABNORMAL LOW (ref 33–65)
CD4 T Cell Abs: 934 /uL (ref 400–1790)

## 2019-12-20 LAB — COMPLETE METABOLIC PANEL WITH GFR
AG Ratio: 1.4 (calc) (ref 1.0–2.5)
ALT: 15 U/L (ref 6–29)
AST: 20 U/L (ref 10–30)
Albumin: 3.9 g/dL (ref 3.6–5.1)
Alkaline phosphatase (APISO): 65 U/L (ref 31–125)
BUN: 8 mg/dL (ref 7–25)
CO2: 25 mmol/L (ref 20–32)
Calcium: 9.1 mg/dL (ref 8.6–10.2)
Chloride: 107 mmol/L (ref 98–110)
Creat: 0.95 mg/dL (ref 0.50–1.10)
GFR, Est African American: 87 mL/min/{1.73_m2} (ref >=60)
GFR, Est Non African American: 75 mL/min/{1.73_m2} (ref >=60)
Globulin: 2.8 g/dL (calc) (ref 1.9–3.7)
Glucose, Bld: 93 mg/dL (ref 65–99)
Potassium: 4.3 mmol/L (ref 3.5–5.3)
Sodium: 137 mmol/L (ref 135–146)
Total Bilirubin: 0.3 mg/dL (ref 0.2–1.2)
Total Protein: 6.7 g/dL (ref 6.1–8.1)

## 2019-12-20 LAB — CBC WITH DIFFERENTIAL/PLATELET
Absolute Monocytes: 690 cells/uL (ref 200–950)
Basophils Absolute: 30 cells/uL (ref 0–200)
Basophils Relative: 0.5 %
Eosinophils Absolute: 148 cells/uL (ref 15–500)
Eosinophils Relative: 2.5 %
HCT: 34.9 % — ABNORMAL LOW (ref 35.0–45.0)
Hemoglobin: 11.6 g/dL — ABNORMAL LOW (ref 11.7–15.5)
Lymphs Abs: 3233 cells/uL (ref 850–3900)
MCH: 29.9 pg (ref 27.0–33.0)
MCHC: 33.2 g/dL (ref 32.0–36.0)
MCV: 89.9 fL (ref 80.0–100.0)
MPV: 9.9 fL (ref 7.5–12.5)
Monocytes Relative: 11.7 %
Neutro Abs: 1800 cells/uL (ref 1500–7800)
Neutrophils Relative %: 30.5 %
Platelets: 341 10*3/uL (ref 140–400)
RBC: 3.88 10*6/uL (ref 3.80–5.10)
RDW: 13.6 % (ref 11.0–15.0)
Total Lymphocyte: 54.8 %
WBC: 5.9 10*3/uL (ref 3.8–10.8)

## 2019-12-20 LAB — HIV-1 RNA QUANT-NO REFLEX-BLD
HIV 1 RNA Quant: <20 copies/mL — AB
HIV-1 RNA Quant, Log: <1.3 Log copies/mL — AB

## 2020-01-01 ENCOUNTER — Encounter: Payer: Medicaid Other | Admitting: Internal Medicine

## 2020-01-16 ENCOUNTER — Encounter: Payer: Self-pay | Admitting: Internal Medicine

## 2020-01-16 ENCOUNTER — Other Ambulatory Visit: Payer: Self-pay

## 2020-01-16 ENCOUNTER — Ambulatory Visit (INDEPENDENT_AMBULATORY_CARE_PROVIDER_SITE_OTHER): Payer: Self-pay | Admitting: Internal Medicine

## 2020-01-16 ENCOUNTER — Ambulatory Visit: Payer: Self-pay

## 2020-01-16 VITALS — BP 136/83 | HR 81 | Temp 98.4°F | Wt 161.2 lb

## 2020-01-16 DIAGNOSIS — B20 Human immunodeficiency virus [HIV] disease: Secondary | ICD-10-CM

## 2020-01-16 DIAGNOSIS — R030 Elevated blood-pressure reading, without diagnosis of hypertension: Secondary | ICD-10-CM

## 2020-01-16 DIAGNOSIS — Z79899 Other long term (current) drug therapy: Secondary | ICD-10-CM

## 2020-01-16 NOTE — Progress Notes (Signed)
RFV: follow up for hiv disease  Patient ID: Colleen Walls, female   DOB: [birth year], 39 y.o.   MRN: [id number]  HPI 39yo F with well controlled hiv disease, asthma, CD 4 count of 934/VL<20 undetectable, takes biktarvy daily.  No close calls for covid-19. Doing well with work. No missing doses.  Outpatient Encounter Medications as of 01/16/2020  Medication Sig  . BIKTARVY 50-200-25 MG TABS tablet TAKE 1 TABLET BY MOUTH EVERY DAY  . PROVENTIL HFA 108 (90 Base) MCG/ACT inhaler INHALE 2 PUFFS INTO THE LUNGS EVERY 6 HOURS AS NEEDED FOR SHORTNESS OF BREATH OR WHEEZING   No facility-administered encounter medications on file as of 01/16/2020.     Patient Active Problem List   Diagnosis Date Noted  . ASCUS with positive high risk HPV 09/17/2012  . HIV (human immunodeficiency virus infection) ([place]) 06/12/2012  . Boil 06/12/2012  . Asthma 06/12/2012     Health Maintenance Due  Topic Date Due  . PAP SMEAR-Modifier  02/26/2019    Social History   Tobacco Use  . Smoking status: Former Smoker    Packs/day: 0.50    Years: 5.00    Pack years: 2.50    Types: Cigarettes  . Smokeless tobacco: Never Used  . Tobacco comment: setting "quit" date for March; interested in opiton  Substance Use Topics  . Alcohol use: Yes    Comment: occasional  . Drug use: No   Review of Systems Constitutional: Negative for fever, chills, diaphoresis, activity change, appetite change, fatigue and unexpected weight change.  HENT: Negative for congestion, sore throat, rhinorrhea, sneezing, trouble swallowing and sinus pressure.  Eyes: Negative for photophobia and visual disturbance.  Respiratory: Negative for cough, chest tightness, shortness of breath, wheezing and stridor.  Cardiovascular: Negative for chest pain, palpitations and leg swelling.  Gastrointestinal: Negative for nausea, vomiting, abdominal pain, diarrhea, constipation, blood in stool, abdominal distention and anal bleeding.  Genitourinary:  Negative for dysuria, hematuria, flank pain and difficulty urinating.  Musculoskeletal: Negative for myalgias, back pain, joint swelling, arthralgias and gait problem.  Skin: Negative for color change, pallor, rash and wound.  Neurological: Negative for dizziness, tremors, weakness and light-headedness.  Hematological: Negative for adenopathy. Does not bruise/bleed easily.  Psychiatric/Behavioral: Negative for behavioral problems, confusion, sleep disturbance, dysphoric mood, decreased concentration and agitation.    Physical Exam   BP 136/83   Pulse 81   Temp 98.4 F (36.9 C) (Oral)   Wt 161 lb 3.2 oz (73.1 kg)   LMP 01/09/2020   BMI 28.56 kg/m   Physical Exam  Constitutional:  oriented to person, place, and time. appears well-developed and well-nourished. No distress.  HENT: [place]/AT, PERRLA, no scleral icterus Mouth/Throat: Oropharynx is clear and moist. No oropharyngeal exudate.  Cardiovascular: Normal rate, regular rhythm and normal heart sounds. Exam reveals no gallop and no friction rub.  No murmur heard.  Pulmonary/Chest: Effort normal and breath sounds normal. No respiratory distress.  has no wheezes.  Neck = supple, no nuchal rigidity Lymphadenopathy: no cervical adenopathy. No axillary adenopathy Neurological: alert and oriented to person, place, and time.  Skin: Skin is warm and dry. No rash noted. No erythema.  Psychiatric: a normal mood and affect.  behavior is normal.   Lab Results  Component Value Date   CD4TCELL 30 (L) 12/17/2019   Lab Results  Component Value Date   CD4TABS 934 12/17/2019   CD4TABS 897 06/19/2019   CD4TABS 770 12/03/2018   Lab Results  Component Value Date  HIV1RNAQUANT <20 DETECTED (A) 12/17/2019   Lab Results  Component Value Date   HEPBSAB POS (A) 06/12/2012   Lab Results  Component Value Date   LABRPR NON-REACTIVE 06/19/2019    CBC Lab Results  Component Value Date   WBC 5.9 12/17/2019   RBC 3.88 12/17/2019   HGB 11.6 (L)  12/17/2019   HCT 34.9 (L) 12/17/2019   PLT 341 12/17/2019   MCV 89.9 12/17/2019   MCH 29.9 12/17/2019   MCHC 33.2 12/17/2019   RDW 13.6 12/17/2019   LYMPHSABS 3,233 12/17/2019   MONOABS 2.1 (H) 03/08/2018   EOSABS 148 12/17/2019    BMET Lab Results  Component Value Date   NA 137 12/17/2019   K 4.3 12/17/2019   CL 107 12/17/2019   CO2 25 12/17/2019   GLUCOSE 93 12/17/2019   BUN 8 12/17/2019   CREATININE 0.95 12/17/2019   CALCIUM 9.1 12/17/2019   GFRNONAA 75 12/17/2019   GFRAA 87 12/17/2019      Assessment and Plan HIV disease = well controlled. Continue on biktarvy  Long term medication management = cr is stable  Health maintenance = gave her info on getting covid vaccine through [facility]  Pre-hypertension = continue to monitor to see if meets criteria for hypertension

## 2020-01-16 NOTE — Patient Instructions (Addendum)
  COVID-19 Vaccine Information can be found at: [email or web address] For questions related to vaccine distribution or appointments, please email [email or web address] or call [phone].   [email or web address].   [employer] vaccine appt [phone]

## 2020-03-11 ENCOUNTER — Encounter (HOSPITAL_COMMUNITY): Payer: Self-pay

## 2020-03-11 ENCOUNTER — Emergency Department (HOSPITAL_COMMUNITY): Payer: Self-pay

## 2020-03-11 ENCOUNTER — Other Ambulatory Visit: Payer: Self-pay

## 2020-03-11 ENCOUNTER — Emergency Department (HOSPITAL_COMMUNITY)
Admission: EM | Admit: 2020-03-11 | Discharge: 2020-03-12 | Disposition: A | Discharge status: Home / Self Care | Payer: Self-pay | Attending: Emergency Medicine | Admitting: Emergency Medicine

## 2020-03-11 DIAGNOSIS — Z87891 Personal history of nicotine dependence: Secondary | ICD-10-CM | POA: Insufficient documentation

## 2020-03-11 DIAGNOSIS — Z21 Asymptomatic human immunodeficiency virus [HIV] infection status: Secondary | ICD-10-CM | POA: Insufficient documentation

## 2020-03-11 DIAGNOSIS — R0789 Other chest pain: Secondary | ICD-10-CM | POA: Insufficient documentation

## 2020-03-11 DIAGNOSIS — R079 Chest pain, unspecified: Secondary | ICD-10-CM

## 2020-03-11 DIAGNOSIS — R0989 Other specified symptoms and signs involving the circulatory and respiratory systems: Secondary | ICD-10-CM | POA: Insufficient documentation

## 2020-03-11 DIAGNOSIS — J45909 Unspecified asthma, uncomplicated: Secondary | ICD-10-CM | POA: Insufficient documentation

## 2020-03-11 LAB — BASIC METABOLIC PANEL
Anion gap: 6 (ref 5–15)
BUN: 8 mg/dL (ref 6–20)
CO2: 25 mmol/L (ref 22–32)
Calcium: 9.3 mg/dL (ref 8.9–10.3)
Chloride: 110 mmol/L (ref 98–111)
Creatinine, Ser: 0.97 mg/dL (ref 0.44–1.00)
GFR calc Af Amer: >60 mL/min (ref >60)
GFR calc non Af Amer: >60 mL/min (ref >60)
Glucose, Bld: 101 mg/dL — ABNORMAL HIGH (ref 70–99)
Potassium: 4.5 mmol/L (ref 3.5–5.1)
Sodium: 141 mmol/L (ref 135–145)

## 2020-03-11 LAB — CBC
HCT: 35.4 % — ABNORMAL LOW (ref 36.0–46.0)
Hemoglobin: 11.2 g/dL — ABNORMAL LOW (ref 12.0–15.0)
MCH: 29.2 pg (ref 26.0–34.0)
MCHC: 31.6 g/dL (ref 30.0–36.0)
MCV: 92.4 fL (ref 80.0–100.0)
Platelets: 345 10*3/uL (ref 150–400)
RBC: 3.83 MIL/uL — ABNORMAL LOW (ref 3.87–5.11)
RDW: 14.5 % (ref 11.5–15.5)
WBC: 6.2 10*3/uL (ref 4.0–10.5)
nRBC: 0 % (ref 0.0–0.2)

## 2020-03-11 LAB — I-STAT BETA HCG BLOOD, ED (NOT ORDERABLE): I-stat hCG, quantitative: <5 m[IU]/mL (ref <5)

## 2020-03-11 LAB — TROPONIN I (HIGH SENSITIVITY)
Troponin I (High Sensitivity): <2 ng/L (ref <18)
Troponin I (High Sensitivity): <2 ng/L (ref <18)

## 2020-03-11 MED ORDER — SODIUM CHLORIDE 0.9% FLUSH
3.0000 mL | Freq: Once | INTRAVENOUS | Status: AC
  Administered 2020-03-12: 3 mL via INTRAVENOUS

## 2020-03-11 NOTE — ED Notes (Signed)
Noticed EKG ordered at 18:14 was not clicked off and no documentation of it being given to a provider.  Called pt back to obtain EKG and she said "another one?"  I was able to locate the EKG completed earlier, it was exported w/ order number and given to EDP since it's unclear if it was given to anyone to review.

## 2020-03-11 NOTE — ED Triage Notes (Addendum)
Patient c/o mid chest pain since this AM. Patient c/o SOB. Patient c/o that she feels like she has "something stuck' in her throat x 3 days. Patient is talking in complete sentences, but does have a raspy voice.

## 2020-03-12 ENCOUNTER — Emergency Department (HOSPITAL_COMMUNITY): Payer: Self-pay

## 2020-03-12 LAB — D-DIMER, QUANTITATIVE: D-Dimer, Quant: 0.5 ug/mL-FEU (ref 0.00–0.50)

## 2020-03-12 LAB — TROPONIN I (HIGH SENSITIVITY): Troponin I (High Sensitivity): 6 ng/L (ref <18)

## 2020-03-12 MED ORDER — SODIUM CHLORIDE (PF) 0.9 % IJ SOLN
INTRAMUSCULAR | Status: AC
  Filled 2020-03-12: qty 50

## 2020-03-12 MED ORDER — IOHEXOL 350 MG/ML SOLN
100.0000 mL | Freq: Once | INTRAVENOUS | Status: AC | PRN
  Administered 2020-03-12: 100 mL via INTRAVENOUS

## 2020-03-12 NOTE — ED Notes (Signed)
Patient transported to CT 

## 2020-03-12 NOTE — ED Notes (Signed)
Pt returned from CT °

## 2020-03-12 NOTE — ED Provider Notes (Signed)
[facility]-EMERGENCY DEPT Provider Note   CSN: [id number] Arrival date & time: 03/11/20  1757     History Chief Complaint  Patient presents with  . Chest Pain  . Aphasia    Colleen Walls is a 39 y.o. female.  Patient presents to the emergency department for evaluation of multiple problems.  Patient reports that she has a scratchy, irritated throat that has been ongoing for 3 days.  She feels slight pain when she swallows and it just feels like something is caught in her throat.  She does not remember eating anything and having a sensation of it becoming caught.    Patient also complains of chest pain.  Pain was a sharp pain in the chest that started earlier today.  It is easing off now.  She has noticed that the pain worsens when she takes a deep breath earlier she did feel slightly short of breath.  No exertional pain.  Pain is not positional or reproducible.        Past Medical History:  Diagnosis Date  . Abnormal Pap smear   . Asthma     Patient Active Problem List   Diagnosis Date Noted  . ASCUS with positive high risk HPV 09/17/2012  . HIV (human immunodeficiency virus infection) (HCC) 06/12/2012  . Boil 06/12/2012  . Asthma 06/12/2012    History reviewed. No pertinent surgical history.   OB History    Gravida  3   Para  3   Term  3   Preterm      AB      Living  3     SAB      TAB      Ectopic      Multiple      Live Births              Family History  Problem Relation Age of Onset  . Healthy Father   . Healthy Mother     Social History   Tobacco Use  . Smoking status: Former Smoker    Packs/day: 0.50    Years: 5.00    Pack years: 2.50    Types: Cigarettes  . Smokeless tobacco: Never Used  . Tobacco comment: setting "quit" date for March; interested in opiton  Substance Use Topics  . Alcohol use: Yes    Comment: occasional  . Drug use: No    Home Medications Prior to Admission medications     Medication Sig Start Date End Date Taking? Authorizing Provider  PROVENTIL HFA 108 (90 Base) MCG/ACT inhaler INHALE 2 PUFFS INTO THE LUNGS EVERY 6 HOURS AS NEEDED FOR SHORTNESS OF BREATH OR WHEEZING Patient taking differently: Inhale 2 puffs into the lungs every 6 (six) hours as needed for wheezing or shortness of breath.  11/23/17  Yes Judyann Munson, MD  BIKTARVY 50-200-25 MG TABS tablet TAKE 1 TABLET BY MOUTH EVERY DAY Patient not taking: Reported on 03/12/2020 06/26/19   Judyann Munson, MD    Allergies    Patient has no known allergies.  Review of Systems   Review of Systems  HENT: Positive for sore throat.   Respiratory: Positive for shortness of breath.   Cardiovascular: Positive for chest pain.  All other systems reviewed and are negative.   Physical Exam Updated Vital Signs BP 110/86 (BP Location: Left Arm)   Pulse 75   Temp 100.1 F (37.8 C) (Oral)   Resp 19   Ht 5\' 3"  (1.6 m)   Wt  74.4 kg   LMP 03/11/2020 Comment: negative HCG 03/11/20  SpO2 100%   BMI 29.05 kg/m   Physical Exam Vitals and nursing note reviewed.  Constitutional:      General: She is not in acute distress.    Appearance: Normal appearance. She is well-developed.  HENT:     Head: Normocephalic and atraumatic.     Right Ear: Hearing normal.     Left Ear: Hearing normal.     Nose: Nose normal.     Mouth/Throat:     Pharynx: Oropharynx is clear. Uvula midline. No pharyngeal swelling, oropharyngeal exudate or posterior oropharyngeal erythema.     Tonsils: No tonsillar exudate or tonsillar abscesses.  Eyes:     Conjunctiva/sclera: Conjunctivae normal.     Pupils: Pupils are equal, round, and reactive to light.  Cardiovascular:     Rate and Rhythm: Regular rhythm.     Heart sounds: S1 normal and S2 normal. No murmur. No friction rub. No gallop.   Pulmonary:     Effort: Pulmonary effort is normal. No respiratory distress.     Breath sounds: Normal breath sounds.  Chest:     Chest wall: No  tenderness.  Abdominal:     General: Bowel sounds are normal.     Palpations: Abdomen is soft.     Tenderness: There is no abdominal tenderness. There is no guarding or rebound. Negative signs include Murphy's sign and McBurney's sign.     Hernia: No hernia is present.  Musculoskeletal:        General: Normal range of motion.     Cervical back: Normal range of motion and neck supple.  Skin:    General: Skin is warm and dry.     Findings: No rash.  Neurological:     Mental Status: She is alert and oriented to person, place, and time.     GCS: GCS eye subscore is 4. GCS verbal subscore is 5. GCS motor subscore is 6.     Cranial Nerves: No cranial nerve deficit.     Sensory: No sensory deficit.     Coordination: Coordination normal.  Psychiatric:        Speech: Speech normal.        Behavior: Behavior normal.        Thought Content: Thought content normal.     ED Results / Procedures / Treatments   Labs (all labs ordered are listed, but only abnormal results are displayed) Labs Reviewed  BASIC METABOLIC PANEL - Abnormal; Notable for the following components:      Result Value   Glucose, Bld 101 (*)    All other components within normal limits  CBC - Abnormal; Notable for the following components:   RBC 3.83 (*)    Hemoglobin 11.2 (*)    HCT 35.4 (*)    All other components within normal limits  D-DIMER, QUANTITATIVE (NOT AT [facility])  I-STAT BETA HCG BLOOD, ED ([facility], [facility], [facility] ONLY)  I-STAT BETA HCG BLOOD, ED (NOT ORDERABLE)  TROPONIN I (HIGH SENSITIVITY)  TROPONIN I (HIGH SENSITIVITY)  TROPONIN I (HIGH SENSITIVITY)    EKG None  Radiology DG Chest 2 View  Result Date: 03/11/2020 CLINICAL DATA:  39 year old female with chest pain. EXAM: CHEST - 2 VIEW COMPARISON:  None. FINDINGS: The heart size and mediastinal contours are within normal limits. Both lungs are clear. The visualized skeletal structures are unremarkable. IMPRESSION: No active cardiopulmonary disease. Electronically  Signed   By: Ceasar Mons.D.  On: 03/11/2020 19:59   CT Soft Tissue Neck W Contrast  Result Date: 03/12/2020 CLINICAL DATA:  Initial evaluation for acute neck pain, sensation of object stuck in throat. EXAM: CT NECK WITH CONTRAST TECHNIQUE: Multidetector CT imaging of the neck was performed using the standard protocol following the bolus administration of intravenous contrast. CONTRAST:  OMNIPAQUE IOHEXOL 350 MG/ML SOLN COMPARISON:  None available. FINDINGS: Pharynx and larynx: Oral cavity within normal limits without discrete mass or collection. No acute inflammatory changes seen about the dentition. Palatine tonsils symmetric and within normal limits. No tonsillar or peritonsillar abscess. Parapharyngeal fat maintained. Remainder of the oropharynx and nasopharynx within normal limits. No retropharyngeal collection or swelling. Epiglottis within normal limits. Vallecula partially effaced by the lingual tonsils. Remainder of the hypopharynx and supraglottic larynx within normal limits. True cords symmetric and normal. Subglottic airway clear. No radiopaque foreign body identified. Salivary glands: Salivary glands including the parotid and submandibular glands are within normal limits. Thyroid: Normal. Lymph nodes: No pathologically enlarged lymph nodes identified within the neck. No adenopathy within the visualized upper chest. Vascular: Normal intravascular enhancement seen throughout the neck. Limited intracranial: Unremarkable. Visualized orbits: Globes and orbital soft tissues within normal limits. Mastoids and visualized paranasal sinuses: Visualized paranasal sinuses are largely clear. Visualize mastoids and middle ear cavities are well pneumatized and free of fluid. Skeleton: No acute osseous abnormality. No discrete or worrisome osseous lesions. Mild to moderate cervical spondylosis present at C3-4 through C6-7. Upper chest: Visualized upper chest demonstrates no acute finding. Partially  visualized lungs are clear. Other: None. IMPRESSION: 1. Negative CT of the neck. No acute inflammatory changes or other abnormality identified. No radiopaque foreign body identified. 2. Mild to moderate cervical spondylosis at C3-4 through C6-7. Electronically Signed   By: Rise Mu M.D.   On: 03/12/2020 03:26   CT ANGIO CHEST PE W OR WO CONTRAST  Result Date: 03/12/2020 CLINICAL DATA:  Shortness of breath EXAM: CT ANGIOGRAPHY CHEST WITH CONTRAST TECHNIQUE: Multidetector CT imaging of the chest was performed using the standard protocol during bolus administration of intravenous contrast. Multiplanar CT image reconstructions and MIPs were obtained to evaluate the vascular anatomy. CONTRAST:  OMNIPAQUE IOHEXOL 350 MG/ML SOLN COMPARISON:  None. FINDINGS: Cardiovascular: There is a optimal opacification of the pulmonary arteries. There is no central,segmental, or subsegmental filling defects within the pulmonary arteries. The heart is normal in size. No pericardial effusion or thickening. No evidence right heart strain. There is normal three-vessel brachiocephalic anatomy without proximal stenosis. The thoracic aorta is normal in appearance. Mediastinum/Nodes: No hilar, mediastinal, or axillary adenopathy. Thyroid gland, trachea, and esophagus demonstrate no significant findings. Lungs/Pleura: Minimal bibasilar atelectasis is noted. No pleural effusion or pneumothorax. No airspace consolidation. Upper Abdomen: No acute abnormalities present in the visualized portions of the upper abdomen. Musculoskeletal: No chest wall abnormality. No acute or significant osseous findings. Review of the MIP images confirms the above findings. IMPRESSION: No central, segmental, or subsegmental pulmonary embolism. No other acute intrathoracic pathology to explain the patient's symptoms. Electronically Signed   By: Jonna Clark M.D.   On: 03/12/2020 02:37    Procedures Procedures (including critical care  time)  Medications Ordered in ED Medications  sodium chloride (PF) 0.9 % injection (has no administration in time range)  sodium chloride flush (NS) 0.9 % injection 3 mL (3 mLs Intravenous Given 03/12/20 0200)  iohexol (OMNIPAQUE) 350 MG/ML injection 100 mL (100 mLs Intravenous Contrast Given 03/12/20 0217)    ED Course  I  have reviewed the triage vital signs and the nursing notes.  Pertinent labs & imaging results that were available during my care of the patient were reviewed by me and considered in my medical decision making (see chart for details).    MDM Rules/Calculators/A&P                      Patient presents to the emergency department for evaluation of multiple complaints.  With complaints of chest pain which is pleuritic in nature.  She does not have any cardiac risk factors.  EKG was normal other than borderline tachycardia.  Troponin negative.  She also does not have any PE risk factors, but did have a heart rate above 100.  D-dimer was therefore performed and is borderline at 0.5.  Because of this I did perform CT angiography to ensure that she does not have a PE.  No pathology was noted.  Neck was scanned as well and there were no foreign bodies or other masses or abnormalities noted.  Symptoms are likely secondary to pharyngitis from possible URI, but with slight voice change will refer to ENT for further evaluation.  Final Clinical Impression(s) / ED Diagnoses Final diagnoses:  Chest pain, unspecified type  Globus sensation    Rx / DC Orders ED Discharge Orders    None       Orpah Greek, MD 03/12/20 [phone]

## 2020-03-31 ENCOUNTER — Encounter: Payer: Self-pay | Admitting: Internal Medicine

## 2020-07-10 ENCOUNTER — Other Ambulatory Visit: Payer: Self-pay

## 2020-07-10 DIAGNOSIS — B20 Human immunodeficiency virus [HIV] disease: Secondary | ICD-10-CM

## 2020-07-10 DIAGNOSIS — Z113 Encounter for screening for infections with a predominantly sexual mode of transmission: Secondary | ICD-10-CM

## 2020-07-13 ENCOUNTER — Other Ambulatory Visit: Payer: Self-pay

## 2020-07-13 ENCOUNTER — Ambulatory Visit: Payer: Self-pay

## 2020-07-13 DIAGNOSIS — B20 Human immunodeficiency virus [HIV] disease: Secondary | ICD-10-CM

## 2020-07-13 DIAGNOSIS — Z113 Encounter for screening for infections with a predominantly sexual mode of transmission: Secondary | ICD-10-CM

## 2020-07-14 LAB — URINE CYTOLOGY ANCILLARY ONLY
Chlamydia: NEGATIVE
Comment: NEGATIVE
Comment: NORMAL
Neisseria Gonorrhea: NEGATIVE

## 2020-07-14 LAB — T-HELPER CELL (CD4) - (RCID CLINIC ONLY)
CD4 % Helper T Cell: 32 % — ABNORMAL LOW (ref 33–65)
CD4 T Cell Abs: 969 /uL (ref 400–1790)

## 2020-07-15 LAB — COMPLETE METABOLIC PANEL WITH GFR
AG Ratio: 1.4 (calc) (ref 1.0–2.5)
ALT: 14 U/L (ref 6–29)
AST: 18 U/L (ref 10–30)
Albumin: 4.1 g/dL (ref 3.6–5.1)
Alkaline phosphatase (APISO): 68 U/L (ref 31–125)
BUN: 8 mg/dL (ref 7–25)
CO2: 25 mmol/L (ref 20–32)
Calcium: 9.3 mg/dL (ref 8.6–10.2)
Chloride: 109 mmol/L (ref 98–110)
Creat: 0.83 mg/dL (ref 0.50–1.10)
GFR, Est African American: 102 mL/min/{1.73_m2} (ref >=60)
GFR, Est Non African American: 88 mL/min/{1.73_m2} (ref >=60)
Globulin: 2.9 g/dL (calc) (ref 1.9–3.7)
Glucose, Bld: 80 mg/dL (ref 65–99)
Potassium: 4.3 mmol/L (ref 3.5–5.3)
Sodium: 137 mmol/L (ref 135–146)
Total Bilirubin: 0.3 mg/dL (ref 0.2–1.2)
Total Protein: 7 g/dL (ref 6.1–8.1)

## 2020-07-15 LAB — CBC WITH DIFFERENTIAL/PLATELET
Absolute Monocytes: 720 cells/uL (ref 200–950)
Basophils Absolute: 31 cells/uL (ref 0–200)
Basophils Relative: 0.5 %
Eosinophils Absolute: 110 cells/uL (ref 15–500)
Eosinophils Relative: 1.8 %
HCT: 36.7 % (ref 35.0–45.0)
Hemoglobin: 11.9 g/dL (ref 11.7–15.5)
Lymphs Abs: 3294 cells/uL (ref 850–3900)
MCH: 29.6 pg (ref 27.0–33.0)
MCHC: 32.4 g/dL (ref 32.0–36.0)
MCV: 91.3 fL (ref 80.0–100.0)
MPV: 9.3 fL (ref 7.5–12.5)
Monocytes Relative: 11.8 %
Neutro Abs: 1946 cells/uL (ref 1500–7800)
Neutrophils Relative %: 31.9 %
Platelets: 331 10*3/uL (ref 140–400)
RBC: 4.02 10*6/uL (ref 3.80–5.10)
RDW: 14.4 % (ref 11.0–15.0)
Total Lymphocyte: 54 %
WBC: 6.1 10*3/uL (ref 3.8–10.8)

## 2020-07-15 LAB — RPR: RPR Ser Ql: NONREACTIVE

## 2020-07-15 LAB — HIV-1 RNA QUANT-NO REFLEX-BLD
HIV 1 RNA Quant: <20 Copies/mL — ABNORMAL HIGH
HIV-1 RNA Quant, Log: <1.3 Log cps/mL — ABNORMAL HIGH

## 2020-07-24 ENCOUNTER — Other Ambulatory Visit: Payer: Self-pay | Admitting: Internal Medicine

## 2020-07-24 ENCOUNTER — Encounter: Payer: Self-pay | Admitting: Internal Medicine

## 2020-07-24 ENCOUNTER — Other Ambulatory Visit: Payer: Self-pay

## 2020-07-24 MED ORDER — BIKTARVY 50-200-25 MG PO TABS: 1.0000 | ORAL_TABLET | Freq: Every day | ORAL | 0 refills | Status: DC

## 2020-07-27 ENCOUNTER — Other Ambulatory Visit: Payer: Self-pay | Admitting: *Deleted

## 2020-07-27 DIAGNOSIS — Z21 Asymptomatic human immunodeficiency virus [HIV] infection status: Secondary | ICD-10-CM

## 2020-07-27 MED ORDER — BIKTARVY 50-200-25 MG PO TABS: 1.0000 | ORAL_TABLET | Freq: Every day | ORAL | 3 refills | Status: DC

## 2020-07-28 ENCOUNTER — Encounter: Payer: Self-pay | Admitting: Internal Medicine

## 2020-07-28 ENCOUNTER — Other Ambulatory Visit: Payer: Self-pay

## 2020-07-28 ENCOUNTER — Ambulatory Visit (INDEPENDENT_AMBULATORY_CARE_PROVIDER_SITE_OTHER): Payer: Self-pay | Admitting: Internal Medicine

## 2020-07-28 VITALS — BP 130/77 | HR 89 | Temp 98.3°F | Wt 153.0 lb

## 2020-07-28 DIAGNOSIS — Z23 Encounter for immunization: Secondary | ICD-10-CM

## 2020-07-28 DIAGNOSIS — B20 Human immunodeficiency virus [HIV] disease: Secondary | ICD-10-CM

## 2020-07-28 DIAGNOSIS — Z7189 Other specified counseling: Secondary | ICD-10-CM

## 2020-07-28 DIAGNOSIS — F5102 Adjustment insomnia: Secondary | ICD-10-CM

## 2020-07-28 DIAGNOSIS — F4321 Adjustment disorder with depressed mood: Secondary | ICD-10-CM

## 2020-07-28 DIAGNOSIS — F32A Depression, unspecified: Secondary | ICD-10-CM

## 2020-07-28 DIAGNOSIS — G4700 Insomnia, unspecified: Secondary | ICD-10-CM

## 2020-07-28 MED ORDER — TRAZODONE HCL 50 MG PO TABS
25.0000 mg | ORAL_TABLET | Freq: Every day | ORAL | 0 refills | Status: DC
Start: 2020-07-28 — End: 2023-04-12

## 2020-07-28 NOTE — Progress Notes (Signed)
RFV: follow up for hiv disease  Patient ID: Colleen Walls, female   DOB: 14-Jun-1981, 39 y.o.   MRN: [id number]  HPI [name] is 39yo F with hiv disease, Cd 4 count 969/VL<20 ( oct 2021) on biktarvy. She reports good adherence to medication but reports that she is doing poorly, mainly depression  Aunt died in November 07, 2022 of renal disease ( who raised her),and uncle passed away as well that same week. Followed by, Her twin brother passed away in car accident this past week. Fell asleep at the wheel after work. She reports he was her closest confident. She spoke to him daily. She is not close to her other sibilings. They had service in [place]. Stayed with her family for 2/5 weeks, but felt too much arguing.  Younger sister - in [place]. Baby sister - not speaking to her. She reports still emotionally labile, crying spontaneously, poor sleep. Has been off of work through oct 29th for now.  Outpatient Encounter Medications as of 07/28/2020  Medication Sig  . bictegravir-emtricitabine-tenofovir AF (BIKTARVY) 50-200-25 MG TABS tablet Take 1 tablet by mouth daily.  Marland Kitchen PROVENTIL HFA 108 (90 Base) MCG/ACT inhaler INHALE 2 PUFFS INTO THE LUNGS EVERY 6 HOURS AS NEEDED FOR SHORTNESS OF BREATH OR WHEEZING (Patient taking differently: Inhale 2 puffs into the lungs every 6 (six) hours as needed for wheezing or shortness of breath. )   No facility-administered encounter medications on file as of 07/28/2020.     Patient Active Problem List   Diagnosis Date Noted  . ASCUS with positive high risk HPV 09/17/2012  . HIV (human immunodeficiency virus infection) (HCC) 06/12/2012  . Boil 06/12/2012  . Asthma 06/12/2012     Health Maintenance Due  Topic Date Due  . PAP SMEAR-Modifier  02/26/2019     Review of Systems +depression. 12 point ros is negative Physical Exam   BP 130/77   Pulse 89   Temp 98.3 F (36.8 C) (Oral)   Wt 153 lb (69.4 kg)   BMI 27.10 kg/m   Physical Exam  Constitutional:  oriented to  person, place, and time. appears well-developed and well-nourished. No distress.  HENT: [place]/AT, PERRLA, no scleral icterus Mouth/Throat: Oropharynx is clear and moist. No oropharyngeal exudate.  Cardiovascular: Normal rate, regular rhythm and normal heart sounds. Exam reveals no gallop and no friction rub.  No murmur heard.  Pulmonary/Chest: Effort normal and breath sounds normal. No respiratory distress.  has no wheezes.  Neck = supple, no nuchal rigidity Lymphadenopathy: no cervical adenopathy. No axillary adenopathy Neurological: alert and oriented to person, place, and time.  Skin: Skin is warm and dry. No rash noted. No erythema.  Psychiatric: tearful   Lab Results  Component Value Date   CD4TCELL 32 (L) 07/13/2020   Lab Results  Component Value Date   CD4TABS 969 07/13/2020   CD4TABS 934 12/17/2019   CD4TABS 897 06/19/2019   Lab Results  Component Value Date   HIV1RNAQUANT <20 (H) 07/13/2020   Lab Results  Component Value Date   HEPBSAB POS (A) 06/12/2012   Lab Results  Component Value Date   LABRPR NON-REACTIVE 07/13/2020    CBC Lab Results  Component Value Date   WBC 6.1 07/13/2020   RBC 4.02 07/13/2020   HGB 11.9 07/13/2020   HCT 36.7 07/13/2020   PLT 331 07/13/2020   MCV 91.3 07/13/2020   MCH 29.6 07/13/2020   MCHC 32.4 07/13/2020   RDW 14.4 07/13/2020   LYMPHSABS 3,294 07/13/2020   MONOABS 2.1 (  H) 03/08/2018   EOSABS 110 07/13/2020    BMET Lab Results  Component Value Date   NA 137 07/13/2020   K 4.3 07/13/2020   CL 109 07/13/2020   CO2 25 07/13/2020   GLUCOSE 80 07/13/2020   BUN 8 07/13/2020   CREATININE 0.83 07/13/2020   CALCIUM 9.3 07/13/2020   GFRNONAA 88 07/13/2020   GFRAA 102 07/13/2020    Assessment and Plan  HIV disease= well controlled. Continue on biktarvy  Acute grief = discussed methods of grieving. Gave counselor information  Health maintenance = received 1st dose. 2nd dose of pfizer due at the end of the month. Gave flu  shot today  Insomnia = likely due to depression. Will give trazodone to use if needed.   RTC in 1 month to check on depression.

## 2020-09-01 ENCOUNTER — Ambulatory Visit: Payer: Medicaid Other | Admitting: Internal Medicine

## 2020-09-22 ENCOUNTER — Encounter: Payer: Self-pay | Admitting: Internal Medicine

## 2020-09-22 ENCOUNTER — Other Ambulatory Visit: Payer: Self-pay

## 2020-09-22 ENCOUNTER — Ambulatory Visit (INDEPENDENT_AMBULATORY_CARE_PROVIDER_SITE_OTHER): Payer: Self-pay | Admitting: Internal Medicine

## 2020-09-22 VITALS — BP 143/91 | HR 93 | Temp 98.4°F | Wt 152.6 lb

## 2020-09-22 DIAGNOSIS — Z79899 Other long term (current) drug therapy: Secondary | ICD-10-CM

## 2020-09-22 DIAGNOSIS — Z716 Tobacco abuse counseling: Secondary | ICD-10-CM

## 2020-09-22 DIAGNOSIS — B20 Human immunodeficiency virus [HIV] disease: Secondary | ICD-10-CM

## 2020-09-22 DIAGNOSIS — Z0000 Encounter for general adult medical examination without abnormal findings: Secondary | ICD-10-CM

## 2020-09-22 MED ORDER — VARENICLINE TARTRATE 1 MG PO TABS
1.0000 mg | ORAL_TABLET | Freq: Two times a day (BID) | ORAL | 3 refills | Status: DC
Start: 2020-09-22 — End: 2021-09-21

## 2020-09-22 MED ORDER — CHANTIX STARTING MONTH PAK 0.5 MG X 11 & 1 MG X 42 PO TABS: ORAL_TABLET | ORAL | 0 refills | Status: DC

## 2020-09-22 NOTE — Progress Notes (Signed)
RFV: follow up HIV disease  Patient ID: Colleen Walls, female   DOB: 03-28-1981, 39 y.o.   MRN: [id number]  HPI 39yo F with HIV disease, CD 4 count 969/LV<20 on biktarvy.   Has had very supportive kids, 3 of them has been present for her. She is recovering from her grief over her brother's death.  Continues to do well with taking medications. No recent illness. 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 bictegravir-emtricitabine-tenofovir AF (BIKTARVY) 50-200-25 MG TABS tablet Take 1 tablet by mouth daily.  Marland Kitchen PROVENTIL HFA 108 (90 Base) MCG/ACT inhaler INHALE 2 PUFFS INTO THE LUNGS EVERY 6 HOURS AS NEEDED FOR SHORTNESS OF BREATH OR WHEEZING (Patient taking differently: Inhale 2 puffs into the lungs every 6 (six) hours as needed for wheezing or shortness of breath.)  . traZODone (DESYREL) 50 MG tablet Take 0.5 tablets (25 mg total) by mouth at bedtime.   No facility-administered encounter medications on file as of 09/22/2020.     Patient Active Problem List   Diagnosis Date Noted  . ASCUS with positive high risk HPV 09/17/2012  . HIV (human immunodeficiency virus infection) (HCC) 06/12/2012  . Boil 06/12/2012  . Asthma 06/12/2012     Health Maintenance Due  Topic Date Due  . PAP SMEAR-Modifier  02/26/2019  . COVID-19 Vaccine (3 - Pfizer risk 4-dose series) 09/03/2020    Smoking black mall 1 cig/ and days off 2-3 black mal- on days off.  Review of Systems Review of Systems  Constitutional: Negative for fever, chills, diaphoresis, activity change, appetite change, fatigue and unexpected weight change.  HENT: Negative for congestion, sore throat, rhinorrhea, sneezing, trouble swallowing and sinus pressure.  Eyes: Negative for photophobia and visual disturbance.  Respiratory: Negative for cough, chest tightness, shortness of breath, wheezing  and stridor.  Cardiovascular: Negative for chest pain, palpitations and leg swelling.  Gastrointestinal: Negative for nausea, vomiting, abdominal pain, diarrhea, constipation, blood in stool, abdominal distention and anal bleeding.  Genitourinary: Negative for dysuria, hematuria, flank pain and difficulty urinating.  Musculoskeletal: Negative for myalgias, back pain, joint swelling, arthralgias and gait problem.  Skin: Negative for color change, pallor, rash and wound.  Neurological: Negative for dizziness, tremors, weakness and light-headedness.  Hematological: Negative for adenopathy. Does not bruise/bleed easily.  Psychiatric/Behavioral: Negative for behavioral problems, confusion, sleep disturbance, dysphoric mood, decreased concentration and agitation.    Physical Exam   BP (!) 143/91   Pulse 93   Temp 98.4 F (36.9 C) (Oral)   Wt 152 lb 9.6 oz (69.2 kg)   BMI 27.03 kg/m   Physical Exam  Constitutional:  oriented to person, place, and time. appears well-developed and well-nourished. No distress.  HENT: [place]/AT, PERRLA, no scleral icterus Mouth/Throat: Oropharynx is clear and moist. No oropharyngeal exudate.  Cardiovascular: Normal rate, regular rhythm and normal heart sounds. Exam reveals no gallop and no friction rub.  No murmur heard.  Pulmonary/Chest: Effort normal and breath sounds normal. No respiratory distress.  has no wheezes.  Neck = supple, no nuchal rigidity Abdominal: Soft. Bowel sounds are normal.  exhibits no distension. There is no tenderness.  Lymphadenopathy: no cervical adenopathy. No axillary adenopathy Neurological: alert and oriented to person, place, and time.  Skin: Skin is warm and dry. No rash noted. No erythema.  Psychiatric: a normal mood and affect.  behavior is  normal.   Lab Results  Component Value Date   CD4TCELL 32 (L) 07/13/2020   Lab Results  Component Value Date   CD4TABS 969 07/13/2020   CD4TABS 934 12/17/2019   CD4TABS 897 06/19/2019    Lab Results  Component Value Date   HIV1RNAQUANT <20 (H) 07/13/2020   Lab Results  Component Value Date   HEPBSAB POS (A) 06/12/2012   Lab Results  Component Value Date   LABRPR NON-REACTIVE 07/13/2020    CBC Lab Results  Component Value Date   WBC 6.1 07/13/2020   RBC 4.02 07/13/2020   HGB 11.9 07/13/2020   HCT 36.7 07/13/2020   PLT 331 07/13/2020   MCV 91.3 07/13/2020   MCH 29.6 07/13/2020   MCHC 32.4 07/13/2020   RDW 14.4 07/13/2020   LYMPHSABS 3,294 07/13/2020   MONOABS 2.1 (H) 03/08/2018   EOSABS 110 07/13/2020    BMET Lab Results  Component Value Date   NA 137 07/13/2020   K 4.3 07/13/2020   CL 109 07/13/2020   CO2 25 07/13/2020   GLUCOSE 80 07/13/2020   BUN 8 07/13/2020   CREATININE 0.83 07/13/2020   CALCIUM 9.3 07/13/2020   GFRNONAA 88 07/13/2020   GFRAA 102 07/13/2020      Assessment and Plan Health maintenance= Would get 3rd dose of pfizer at end of December - trying to get back work 6 [address] for [employer], in addition private duty 11hrs for 3 days a week. Has received flu vaccine  hiv disease= well controlled. Give refills  Smoking cessation = willing to start chantix. Before I fully smoke.  Long term medication management = cr is stable

## 2020-09-25 LAB — QUANTIFERON-TB GOLD PLUS
Mitogen-NIL: >10 IU/mL
NIL: 0.04 IU/mL
QuantiFERON-TB Gold Plus: NEGATIVE
TB1-NIL: 0.02 IU/mL
TB2-NIL: 0.01 IU/mL

## 2020-12-09 ENCOUNTER — Ambulatory Visit: Payer: Self-pay

## 2020-12-09 ENCOUNTER — Other Ambulatory Visit: Payer: Self-pay

## 2020-12-09 ENCOUNTER — Encounter: Payer: Self-pay | Admitting: Internal Medicine

## 2020-12-21 ENCOUNTER — Ambulatory Visit: Payer: Medicaid Other

## 2020-12-21 ENCOUNTER — Ambulatory Visit: Payer: Medicaid Other | Admitting: Internal Medicine

## 2021-02-11 ENCOUNTER — Other Ambulatory Visit: Payer: Self-pay | Admitting: Internal Medicine

## 2021-02-11 DIAGNOSIS — Z21 Asymptomatic human immunodeficiency virus [HIV] infection status: Secondary | ICD-10-CM

## 2021-02-12 ENCOUNTER — Telehealth: Payer: Self-pay

## 2021-02-12 NOTE — Telephone Encounter (Signed)
Called patient to get an appointment scheduled, left a voicemail for patient to call us back and get it scheduled

## 2021-03-11 ENCOUNTER — Other Ambulatory Visit: Payer: Self-pay | Admitting: Internal Medicine

## 2021-03-11 DIAGNOSIS — Z21 Asymptomatic human immunodeficiency virus [HIV] infection status: Secondary | ICD-10-CM

## 2021-03-22 ENCOUNTER — Telehealth: Payer: Self-pay

## 2021-03-22 ENCOUNTER — Other Ambulatory Visit: Payer: Self-pay | Admitting: Infectious Diseases

## 2021-03-22 DIAGNOSIS — B20 Human immunodeficiency virus [HIV] disease: Secondary | ICD-10-CM

## 2021-03-22 NOTE — Telephone Encounter (Signed)
Called patient to get an appointment scheduled, call went straight to voicemail.. Left a voicemail for patient to call us back and get scheduled

## 2021-04-05 ENCOUNTER — Ambulatory Visit: Payer: Medicaid Other | Admitting: Internal Medicine

## 2021-05-07 ENCOUNTER — Other Ambulatory Visit: Payer: Self-pay | Admitting: Internal Medicine

## 2021-05-07 ENCOUNTER — Telehealth: Payer: Self-pay

## 2021-05-07 DIAGNOSIS — Z21 Asymptomatic human immunodeficiency virus [HIV] infection status: Secondary | ICD-10-CM

## 2021-05-07 NOTE — Telephone Encounter (Signed)
Attempted to call patient to schedule overdue appt for labs, financial, and office visit. Patient last seen in 09/2020. Not able to reach patient at this time. Call was dropped. Will send mychart message requesting patient schedule appointments before financial assistance ends. Juanita Laster, RMA

## 2021-05-21 ENCOUNTER — Telehealth: Payer: Self-pay

## 2021-05-21 NOTE — Telephone Encounter (Signed)
Left a voice mail to call back to schedule an appointment for a pap with Dixon after a request we received.  Appointment Request From: Nino Glow    With Provider: Judyann Munson, MD [facility]]    Preferred Date Range: From 05/21/2021 To 05/26/2021    Preferred Times: Any    Reason: To address the following health maintenance concerns.  Pap Smear-Modifier  Pneumococcal Vaccine 31-41 Years Old  Influenza Vaccine    Comments:  I prefer Wednesday any time before 2pm

## 2021-06-03 ENCOUNTER — Ambulatory Visit: Payer: Self-pay | Admitting: Infectious Disease

## 2021-06-03 ENCOUNTER — Ambulatory Visit: Payer: Self-pay | Admitting: Internal Medicine

## 2021-07-26 ENCOUNTER — Ambulatory Visit: Payer: Self-pay | Admitting: Internal Medicine

## 2021-07-31 IMAGING — CT CT NECK W/ CM
3 of 4 series · 14 of 33 positions shown, 17 images · IV contrast (omnipaque)
Comparison: None available.

CLINICAL DATA: Initial evaluation for acute neck pain, sensation of
object stuck in throat.

EXAM:
CT NECK WITH CONTRAST
TECHNIQUE: Multidetector CT imaging of the neck was performed using the
standard protocol following the bolus administration of intravenous
contrast.
CONTRAST:  100mL OMNIPAQUE IOHEXOL 350 MG/ML SOLN

[Series 4: axial · axial · 0.39mm/px · z∈[-350,-200]mm · 6 of 113 slices shown, 8 images]
[im 17/113  soft-tissue]
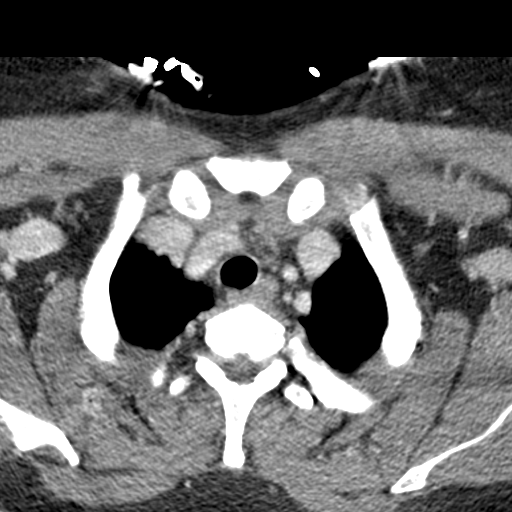
[im 17/113  bone]
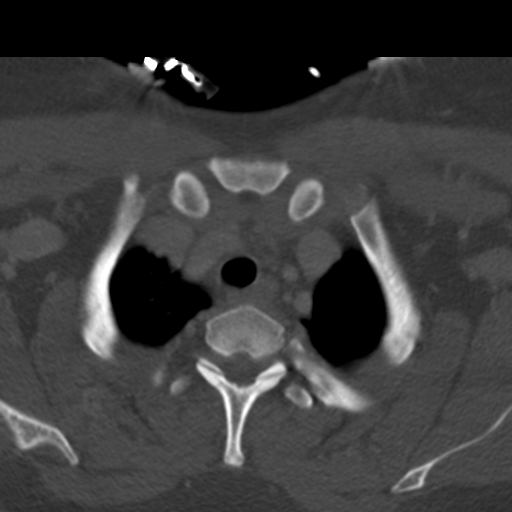
[im 33/113  bone]
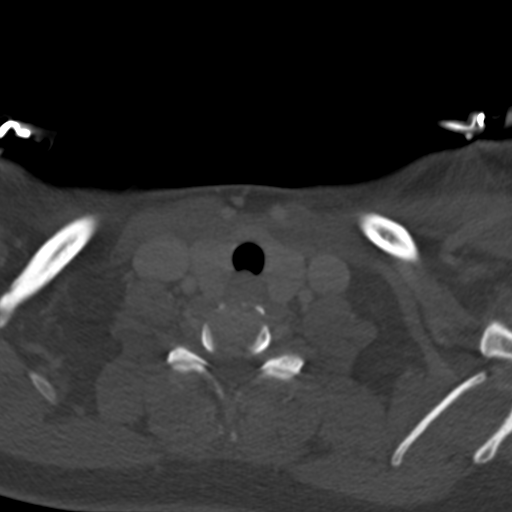
[im 49/113  bone]
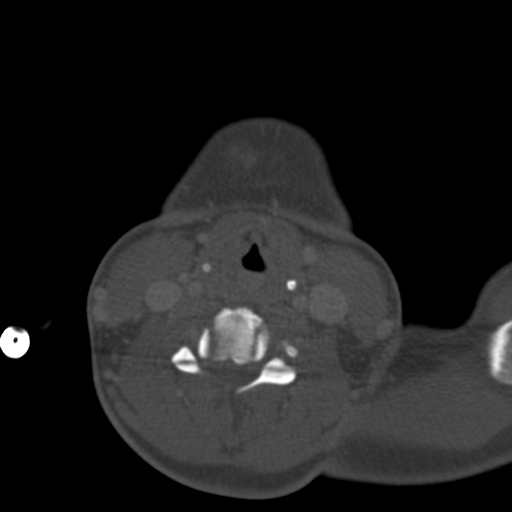
[im 65/113  bone]
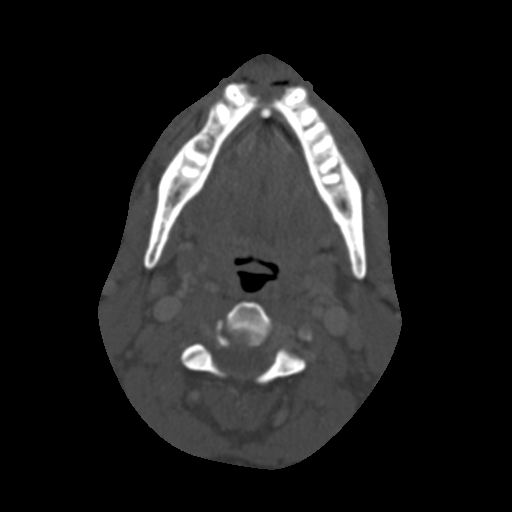
[im 81/113  soft-tissue]
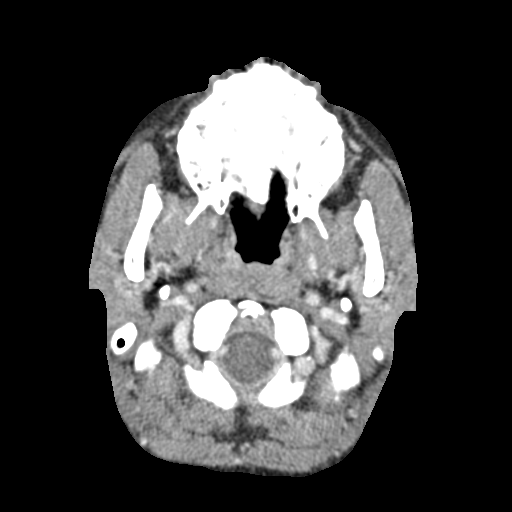
[im 81/113  bone]
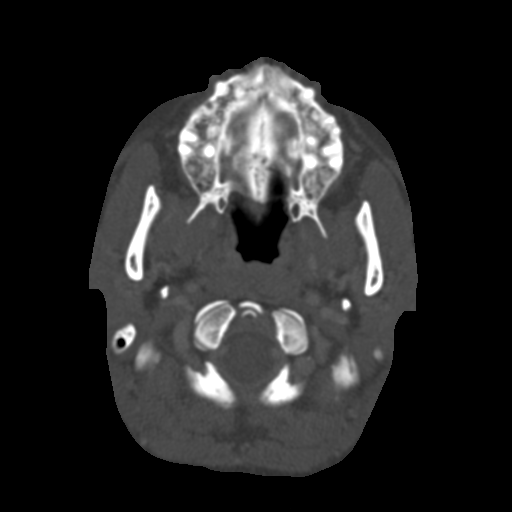
[im 97/113  bone]
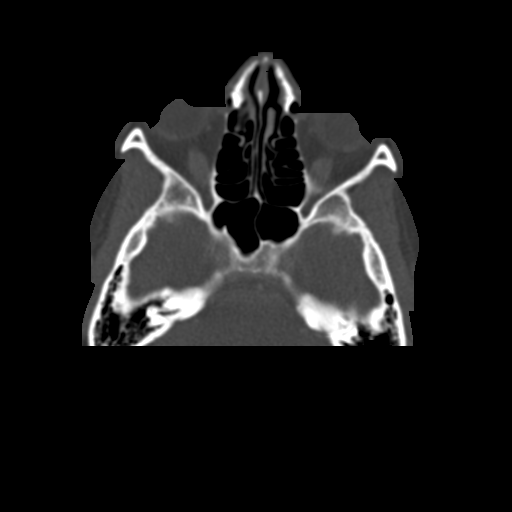

[Series 5: coronal · coronal · 0.40mm/px · 3 of 92 slices shown]
[im 26/92  bone]
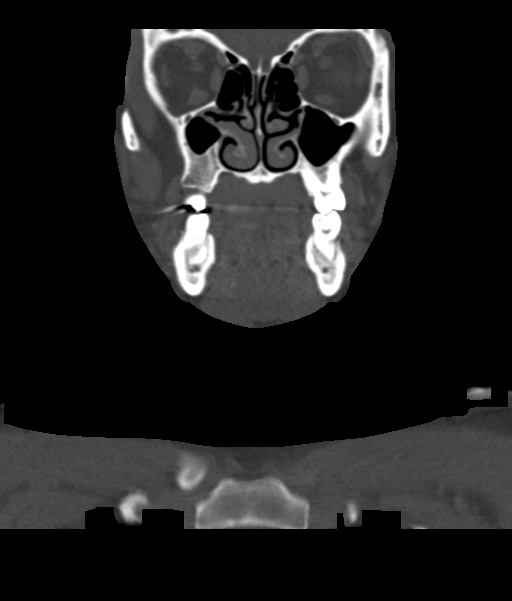
[im 39/92  bone]
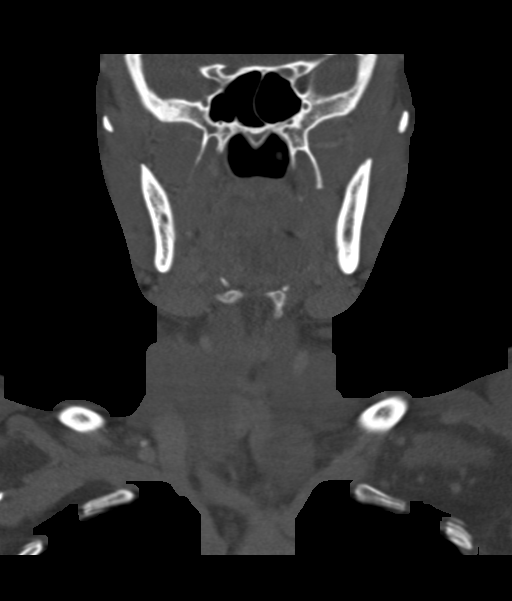
[im 52/92  bone]
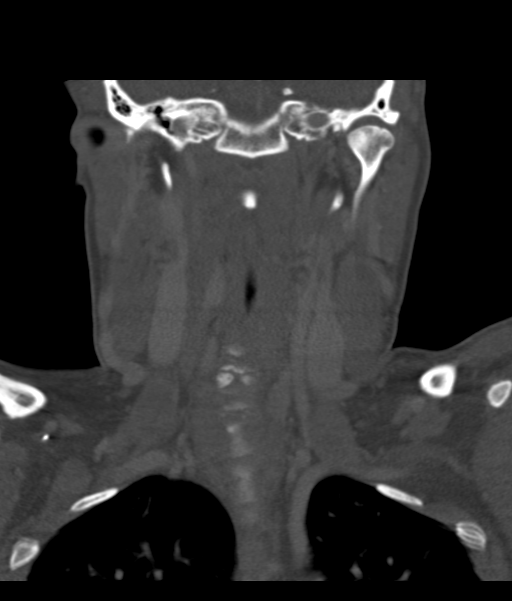

[Series 6: sagittal · sagittal · 0.38mm/px · 5 of 101 slices shown, 6 images]
[im 34/101  bone]
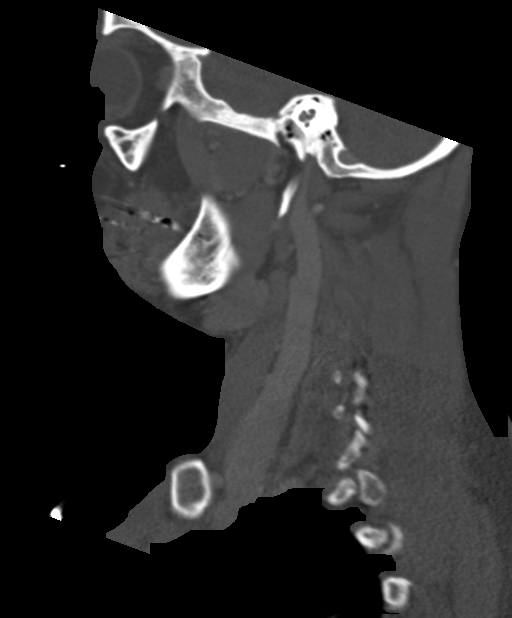
[im 42/101  bone]
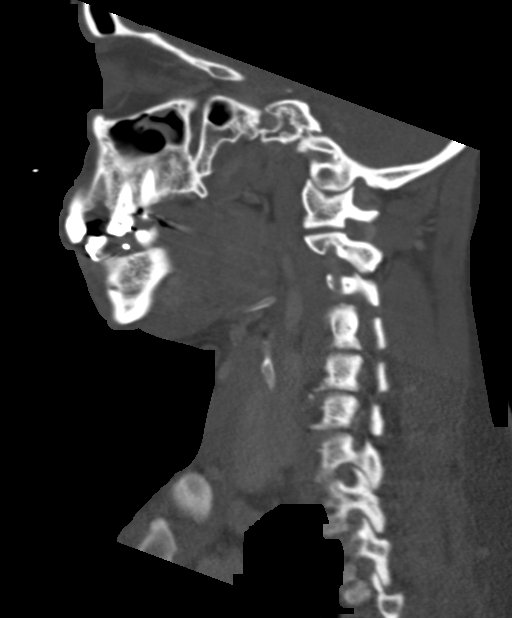
[im 51/101  soft-tissue]
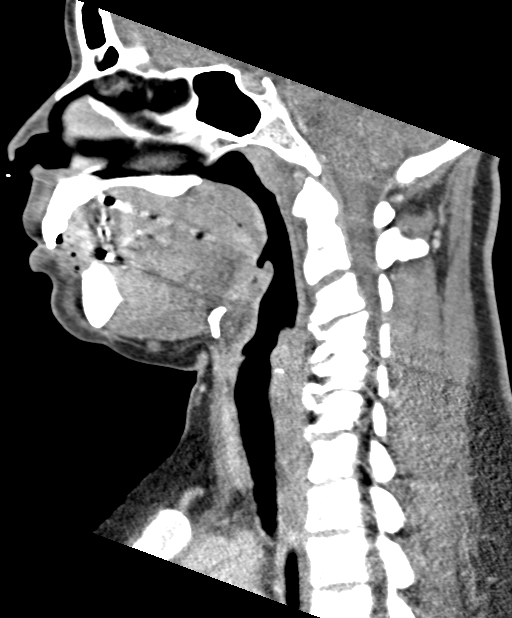
[im 51/101  bone]
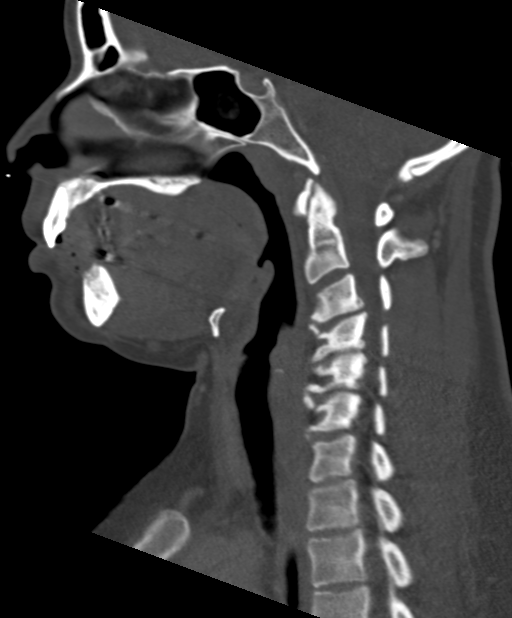
[im 59/101  bone]
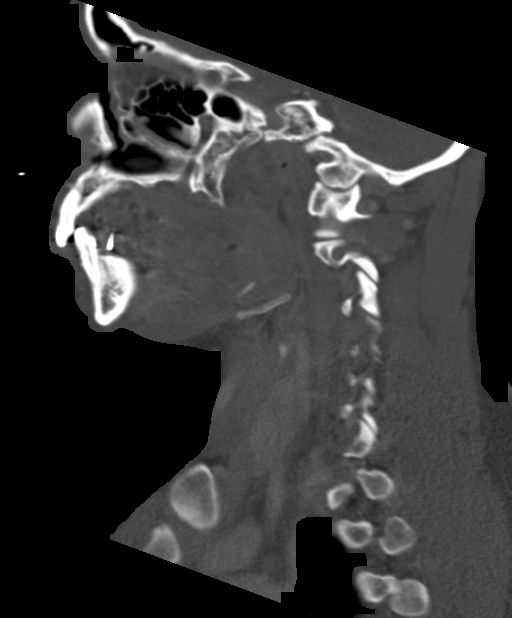
[im 67/101  bone]
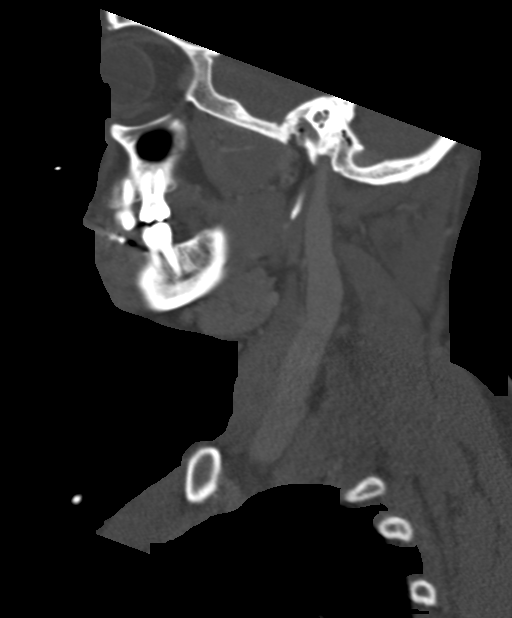

[14 of 33 positions shown; findings below may reference images not displayed]

FINDINGS: Pharynx and larynx: Oral cavity within normal limits without
discrete mass or collection. No acute inflammatory changes seen
about the dentition. Palatine tonsils symmetric and within normal
limits. No tonsillar or peritonsillar abscess. Parapharyngeal fat
maintained. Remainder of the oropharynx and nasopharynx within
normal limits. No retropharyngeal collection or swelling. Epiglottis
within normal limits. Vallecula partially effaced by the lingual
tonsils. Remainder of the hypopharynx and supraglottic larynx within
normal limits. True cords symmetric and normal. Subglottic airway
clear. No radiopaque foreign body identified.

Salivary glands: Salivary glands including the parotid and
submandibular glands are within normal limits.

Thyroid: Normal.

Lymph nodes: No pathologically enlarged lymph nodes identified
within the neck. No adenopathy within the visualized upper chest.

Vascular: Normal intravascular enhancement seen throughout the neck.

Limited intracranial: Unremarkable.

Visualized orbits: Globes and orbital soft tissues within normal
limits.

Mastoids and visualized paranasal sinuses: Visualized paranasal
sinuses are largely clear. Visualize mastoids and middle ear
cavities are well pneumatized and free of fluid.

Skeleton: No acute osseous abnormality. No discrete or worrisome
osseous lesions. Mild to moderate cervical spondylosis present at
C3-4 through C6-7.

Upper chest: Visualized upper chest demonstrates no acute finding.
Partially visualized lungs are clear.

Other: None.
IMPRESSION: 1. Negative CT of the neck. No acute inflammatory changes or other
abnormality identified. No radiopaque foreign body identified.
2. Mild to moderate cervical spondylosis at C3-4 through C6-7.

## 2021-08-23 ENCOUNTER — Telehealth: Payer: Self-pay

## 2021-08-23 NOTE — Telephone Encounter (Signed)
Called patient to get an appointment scheduled, no answer left patient a voicemail for patient to reach back out to get scheduled.

## 2021-08-24 ENCOUNTER — Telehealth: Payer: Self-pay

## 2021-08-24 NOTE — Telephone Encounter (Signed)
Called patient to get an appointment scheduled, left a voicemail for patient to call is back to get scheduled.

## 2021-08-30 ENCOUNTER — Encounter: Payer: Self-pay | Admitting: Internal Medicine

## 2021-09-21 ENCOUNTER — Encounter: Payer: Self-pay | Admitting: Internal Medicine

## 2021-09-21 ENCOUNTER — Other Ambulatory Visit: Payer: Self-pay

## 2021-09-21 ENCOUNTER — Ambulatory Visit (INDEPENDENT_AMBULATORY_CARE_PROVIDER_SITE_OTHER): Payer: Self-pay | Admitting: Internal Medicine

## 2021-09-21 VITALS — BP 122/77 | HR 86 | Temp 98.5°F | Ht 63.0 in | Wt 175.0 lb

## 2021-09-21 DIAGNOSIS — B20 Human immunodeficiency virus [HIV] disease: Secondary | ICD-10-CM

## 2021-09-21 DIAGNOSIS — Z21 Asymptomatic human immunodeficiency virus [HIV] infection status: Secondary | ICD-10-CM

## 2021-09-21 DIAGNOSIS — Z23 Encounter for immunization: Secondary | ICD-10-CM

## 2021-09-21 MED ORDER — BIKTARVY 50-200-25 MG PO TABS
1.0000 | ORAL_TABLET | Freq: Every day | ORAL | 11 refills | Status: DC
Start: 2021-09-21 — End: 2021-11-25

## 2021-09-21 NOTE — Progress Notes (Signed)
RFV: follow up for hiv disease  Patient ID: Colleen Walls, female   DOB: 1981/07/03, 40 y.o.   MRN: [id number]  HPI 40yo F with HIV disease, CD 4 count of 969/VL<20 (nov 2021) on biktarvy- she has not missed doses  Spending holidays in [place]- driving 6 hrs.   She stopped smoking but has had some weight gain   Outpatient Encounter Medications as of 09/21/2021  Medication Sig   BIKTARVY 50-200-25 MG TABS tablet TAKE 1 TABLET BY MOUTH DAILY   PROVENTIL HFA 108 (90 Base) MCG/ACT inhaler INHALE 2 PUFFS INTO THE LUNGS EVERY 6 HOURS AS NEEDED FOR SHORTNESS OF BREATH OR WHEEZING (Patient taking differently: Inhale 2 puffs into the lungs every 6 (six) hours as needed for wheezing or shortness of breath.)   traZODone (DESYREL) 50 MG tablet Take 0.5 tablets (25 mg total) by mouth at bedtime.   varenicline (CHANTIX CONTINUING MONTH PAK) 1 MG tablet Take 1 tablet (1 mg total) by mouth 2 (two) times daily.   varenicline (CHANTIX STARTING MONTH PAK) 0.5 MG X 11 & 1 MG X 42 tablet Take one 0.5 mg tablet by mouth once daily for 3 days, then increase to one 0.5 mg tablet twice daily for 4 days, then increase to one 1 mg tablet twice daily.   No facility-administered encounter medications on file as of 09/21/2021.     Patient Active Problem List   Diagnosis Date Noted   ASCUS with positive high risk HPV 09/17/2012   HIV (human immunodeficiency virus infection) (HCC) 06/12/2012   Boil 06/12/2012   Asthma 06/12/2012     Health Maintenance Due  Topic Date Due   PAP SMEAR-Modifier  02/26/2019   Pneumococcal Vaccine 20-18 Years old (3 - PPSV23 if available, else PCV20) 06/05/2019   COVID-19 Vaccine (3 - Pfizer risk series) 09/03/2020   INFLUENZA VACCINE  05/10/2021    Soc hx: no smoking, occ etoh Review of Systems 12 point ros is negative, except for weight gain Physical Exam   BP 122/77    Pulse 86    Temp 98.5 F (36.9 C) (Oral)    Ht 5\' 3"  (1.6 m)    Wt 175 lb (79.4 kg)    SpO2 100%     BMI 31.00 kg/m   Physical Exam  Constitutional:  oriented to person, place, and time. appears well-developed and well-nourished. No distress.  HENT: [place]/AT, PERRLA, no scleral icterus Mouth/Throat: Oropharynx is clear and moist. No oropharyngeal exudate.  Cardiovascular: Normal rate, regular rhythm and normal heart sounds. Exam reveals no gallop and no friction rub.  No murmur heard.  Pulmonary/Chest: Effort normal and breath sounds normal. No respiratory distress.  has no wheezes.  Neck = supple, no nuchal rigidity Abdominal: Soft. Bowel sounds are normal.  exhibits no distension. There is no tenderness.  Lymphadenopathy: no cervical adenopathy. No axillary adenopathy Neurological: alert and oriented to person, place, and time.  Skin: Skin is warm and dry. No rash noted. No erythema.  Psychiatric: a normal mood and affect.  behavior is normal.   Lab Results  Component Value Date   CD4TCELL 32 (L) 07/13/2020   Lab Results  Component Value Date   CD4TABS 969 07/13/2020   CD4TABS 934 12/17/2019   CD4TABS 897 06/19/2019   Lab Results  Component Value Date   HIV1RNAQUANT <20 (H) 07/13/2020   Lab Results  Component Value Date   HEPBSAB POS (A) 06/12/2012   Lab Results  Component Value Date   LABRPR NON-REACTIVE 07/13/2020  CBC Lab Results  Component Value Date   WBC 6.1 07/13/2020   RBC 4.02 07/13/2020   HGB 11.9 07/13/2020   HCT 36.7 07/13/2020   PLT 331 07/13/2020   MCV 91.3 07/13/2020   MCH 29.6 07/13/2020   MCHC 32.4 07/13/2020   RDW 14.4 07/13/2020   LYMPHSABS 3,294 07/13/2020   MONOABS 2.1 (H) 03/08/2018   EOSABS 110 07/13/2020    BMET Lab Results  Component Value Date   NA 137 07/13/2020   K 4.3 07/13/2020   CL 109 07/13/2020   CO2 25 07/13/2020   GLUCOSE 80 07/13/2020   BUN 8 07/13/2020   CREATININE 0.83 07/13/2020   CALCIUM 9.3 07/13/2020   GFRNONAA 88 07/13/2020   GFRAA 102 07/13/2020      Assessment and Plan  HIV disease =will check  annual labs to see VL and CD 4 count to see if still well controlled  Long term medication management= will check cr to ensure at baseline  Health maintenance = also set up for pap smear, mammo and  Will give flu shot

## 2021-09-21 NOTE — Patient Instructions (Signed)
Initially try to apply twice or daily cream of clotrimazole (anti-fungal cream) x 4 wk  If that is not working  Then do over the counter steroids cream like hydrocortisone cream 1% apply daily

## 2021-09-22 LAB — T-HELPER CELL (CD4) - (RCID CLINIC ONLY)
CD4 % Helper T Cell: 34 % (ref 33–65)
CD4 T Cell Abs: 855 /uL (ref 400–1790)

## 2021-09-23 LAB — COMPLETE METABOLIC PANEL WITH GFR
AG Ratio: 1.3 (calc) (ref 1.0–2.5)
ALT: 14 U/L (ref 6–29)
AST: 18 U/L (ref 10–30)
Albumin: 4 g/dL (ref 3.6–5.1)
Alkaline phosphatase (APISO): 67 U/L (ref 31–125)
BUN: 11 mg/dL (ref 7–25)
CO2: 23 mmol/L (ref 20–32)
Calcium: 9.1 mg/dL (ref 8.6–10.2)
Chloride: 107 mmol/L (ref 98–110)
Creat: 0.87 mg/dL (ref 0.50–0.99)
Globulin: 3 g/dL (calc) (ref 1.9–3.7)
Glucose, Bld: 70 mg/dL (ref 65–99)
Potassium: 4.2 mmol/L (ref 3.5–5.3)
Sodium: 138 mmol/L (ref 135–146)
Total Bilirubin: 0.3 mg/dL (ref 0.2–1.2)
Total Protein: 7 g/dL (ref 6.1–8.1)
eGFR: 86 mL/min/{1.73_m2} (ref >=60)

## 2021-09-23 LAB — CBC WITH DIFFERENTIAL/PLATELET
Absolute Monocytes: 663 cells/uL (ref 200–950)
Basophils Absolute: 43 cells/uL (ref 0–200)
Basophils Relative: 0.7 %
Eosinophils Absolute: 68 cells/uL (ref 15–500)
Eosinophils Relative: 1.1 %
HCT: 36 % (ref 35.0–45.0)
Hemoglobin: 11.6 g/dL — ABNORMAL LOW (ref 11.7–15.5)
Lymphs Abs: 2573 cells/uL (ref 850–3900)
MCH: 28.6 pg (ref 27.0–33.0)
MCHC: 32.2 g/dL (ref 32.0–36.0)
MCV: 88.9 fL (ref 80.0–100.0)
MPV: 10.4 fL (ref 7.5–12.5)
Monocytes Relative: 10.7 %
Neutro Abs: 2852 cells/uL (ref 1500–7800)
Neutrophils Relative %: 46 %
Platelets: 348 10*3/uL (ref 140–400)
RBC: 4.05 10*6/uL (ref 3.80–5.10)
RDW: 13.4 % (ref 11.0–15.0)
Total Lymphocyte: 41.5 %
WBC: 6.2 10*3/uL (ref 3.8–10.8)

## 2021-09-23 LAB — LIPID PANEL
Cholesterol: 181 mg/dL (ref <200)
HDL: 75 mg/dL (ref >=50)
LDL Cholesterol (Calc): 83 mg/dL (calc)
Non-HDL Cholesterol (Calc): 106 mg/dL (calc) (ref <130)
Total CHOL/HDL Ratio: 2.4 (calc) (ref <5.0)
Triglycerides: 130 mg/dL (ref <150)

## 2021-09-23 LAB — HIV-1 RNA QUANT-NO REFLEX-BLD
HIV 1 RNA Quant: NOT DETECTED Copies/mL
HIV-1 RNA Quant, Log: NOT DETECTED Log cps/mL

## 2021-09-23 LAB — RPR: RPR Ser Ql: NONREACTIVE

## 2021-10-08 ENCOUNTER — Encounter: Payer: Self-pay | Admitting: Internal Medicine

## 2021-10-18 ENCOUNTER — Ambulatory Visit (INDEPENDENT_AMBULATORY_CARE_PROVIDER_SITE_OTHER): Payer: Self-pay | Admitting: Infectious Diseases

## 2021-10-18 ENCOUNTER — Other Ambulatory Visit: Payer: Self-pay

## 2021-10-18 ENCOUNTER — Encounter: Payer: Self-pay | Admitting: Infectious Diseases

## 2021-10-18 VITALS — BP 160/87 | HR 104 | Temp 98.6°F | Wt 179.0 lb

## 2021-10-18 DIAGNOSIS — Z124 Encounter for screening for malignant neoplasm of cervix: Secondary | ICD-10-CM | POA: Insufficient documentation

## 2021-10-18 DIAGNOSIS — Z1231 Encounter for screening mammogram for malignant neoplasm of breast: Secondary | ICD-10-CM

## 2021-10-18 NOTE — Assessment & Plan Note (Signed)
Orders placed today for screening. Mammography scholarship information offered/completed to schedule.

## 2021-10-18 NOTE — Patient Instructions (Addendum)
Very nice to meet you! Results should be back in a week or so.   Please continue with Dr. Feliz Beam previous recommendations for treatment.  Please call the [facility] to set up your mammogram.  [phone] [address], [address] [place], [place] [address]

## 2021-10-18 NOTE — Progress Notes (Signed)
° °  ° °  Subjective :    Colleen Walls is a 41 y.o. female here for cervical cancer screening with pap smear.   Review of Systems: Current GYN complaints or concerns: none - has 3 children. No plans for more. Has never had a mammogram before. No family h/o breast cancer.  Patient denies any abdominal/pelvic pain, problems with bowel movements, urination, vaginal discharge or intercourse.    Past Medical History:  Diagnosis Date   Abnormal Pap smear    Asthma     Gynecologic History: G3P3003  No LMP recorded. Contraception: condoms Last Pap: > 68yrs ago. Results were: normal Anal Intercourse:  Last Mammogram: 41yo. Results were: normal    Objective :   Physical Exam - chaperone present  Constitutional: Well developed, well nourished, no acute distress. She is alert and oriented x3.  Pelvic: External genitalia is normal in appearance. The vagina is normal in appearance. The cervix is bulbous and easily visualized. No CMT, normal expected cervical mucus present.  Breasts: symmetrical in contour Psych: She has a normal mood and affect.      Assessment & Plan:    Patient Active Problem List   Diagnosis Date Noted   Screening for cervical cancer 10/18/2021   Encounter for screening mammogram for malignant neoplasm of breast 10/18/2021   ASCUS with positive high risk HPV 09/17/2012   HIV (human immunodeficiency virus infection) ([place]) 06/12/2012   Boil 06/12/2012   Asthma 06/12/2012    Problem List Items Addressed This Visit       Unprioritized   Screening for cervical cancer - Primary    Normal pelvic exam.  Cervical brushing collected for cytology and HPV. >40yrs ago h/o HPV and ASCUS but nothing recurrent recently.  Discussed recommended screening interval for women living with HIV disease meant to be lifelong and at an interval of Q1-3 years pending results. Acceptable to space out Q3y with 3 consecutively normal exams. Further recommendations for Lissy Werning to follow  today's results.  Results will be communicated to the patient via mychart       Relevant Orders   Cytology - PAP( [place])   Encounter for screening mammogram for malignant neoplasm of breast    Orders placed today for screening. Mammography scholarship information offered/completed to schedule.       Relevant Orders   MM Digital Screening      Janene Madeira, MSN, NP-C [facility] [place] Office: [phone] Pager: [phone]  10/18/21 10:58 AM

## 2021-10-18 NOTE — Assessment & Plan Note (Addendum)
Normal pelvic exam.  Cervical brushing collected for cytology and HPV. >35yrs ago h/o HPV and ASCUS but nothing recurrent recently.  Discussed recommended screening interval for women living with HIV disease meant to be lifelong and at an interval of Q1-3 years pending results. Acceptable to space out Q3y with 3 consecutively normal exams. Further recommendations for Colleen Walls to follow today's results.  Results will be communicated to the patient via mychart

## 2021-10-22 LAB — CYTOLOGY - PAP
Adequacy: ABNORMAL
Chlamydia: NEGATIVE
Comment: NEGATIVE
Comment: NORMAL
Neisseria Gonorrhea: NEGATIVE

## 2021-11-01 ENCOUNTER — Encounter: Payer: Self-pay | Admitting: Internal Medicine

## 2021-11-04 ENCOUNTER — Ambulatory Visit: Payer: Medicaid Other

## 2021-11-25 ENCOUNTER — Other Ambulatory Visit: Payer: Self-pay

## 2021-11-25 DIAGNOSIS — Z21 Asymptomatic human immunodeficiency virus [HIV] infection status: Secondary | ICD-10-CM

## 2021-11-25 MED ORDER — BIKTARVY 50-200-25 MG PO TABS: 1.0000 | ORAL_TABLET | Freq: Every day | ORAL | 4 refills | Status: DC

## 2022-03-08 ENCOUNTER — Other Ambulatory Visit: Payer: Self-pay

## 2022-03-08 DIAGNOSIS — Z113 Encounter for screening for infections with a predominantly sexual mode of transmission: Secondary | ICD-10-CM

## 2022-03-08 DIAGNOSIS — Z21 Asymptomatic human immunodeficiency virus [HIV] infection status: Secondary | ICD-10-CM

## 2022-03-22 ENCOUNTER — Encounter: Payer: Medicaid Other | Admitting: Internal Medicine

## 2022-04-16 ENCOUNTER — Other Ambulatory Visit: Payer: Self-pay | Admitting: Internal Medicine

## 2022-04-16 DIAGNOSIS — Z21 Asymptomatic human immunodeficiency virus [HIV] infection status: Secondary | ICD-10-CM

## 2022-04-18 ENCOUNTER — Encounter: Payer: Self-pay | Admitting: Internal Medicine

## 2022-04-18 ENCOUNTER — Other Ambulatory Visit: Payer: Self-pay

## 2022-04-18 ENCOUNTER — Ambulatory Visit (INDEPENDENT_AMBULATORY_CARE_PROVIDER_SITE_OTHER): Payer: Self-pay | Admitting: Internal Medicine

## 2022-04-18 VITALS — BP 130/84 | HR 77 | Temp 97.8°F | Ht 63.0 in | Wt 178.0 lb

## 2022-04-18 DIAGNOSIS — F439 Reaction to severe stress, unspecified: Secondary | ICD-10-CM

## 2022-04-18 DIAGNOSIS — Z23 Encounter for immunization: Secondary | ICD-10-CM

## 2022-04-18 DIAGNOSIS — Z0000 Encounter for general adult medical examination without abnormal findings: Secondary | ICD-10-CM

## 2022-04-18 DIAGNOSIS — Z21 Asymptomatic human immunodeficiency virus [HIV] infection status: Secondary | ICD-10-CM

## 2022-04-18 MED ORDER — BIKTARVY 50-200-25 MG PO TABS: 1.0000 | ORAL_TABLET | Freq: Every day | ORAL | 11 refills | Status: DC

## 2022-04-18 NOTE — Progress Notes (Signed)
Mammogram scholarship form faxed.   Sandie Ano, RN

## 2022-04-18 NOTE — Progress Notes (Signed)
RFV: follow up for hiv disease Patient ID: Colleen Walls, female   DOB: 06-13-1981, 41 y.o.   MRN: [id number]  HPI 41yo F with well controlled hiv disease, CD 4 count of 855/VL<20 (in dec 2022) on biktarvy, hx of asthma.she reports good adherence to medication. Has been in good health, but increased stress due to her father hospitalized for the past 3 months - has 3 other sibilings (all out of town)but Jerre is the closest and caring for her dad who will likely live with her. Now her dad is on HD. Trying to figure out how he moves in with her.  Menstrual cycle still on schedule/regular  Outpatient Encounter Medications as of 04/18/2022  Medication Sig   BIKTARVY 50-200-25 MG TABS tablet TAKE 1 TABLET BY MOUTH DAILY   PROVENTIL HFA 108 (90 Base) MCG/ACT inhaler INHALE 2 PUFFS INTO THE LUNGS EVERY 6 HOURS AS NEEDED FOR SHORTNESS OF BREATH OR WHEEZING (Patient taking differently: Inhale 2 puffs into the lungs every 6 (six) hours as needed for wheezing or shortness of breath.)   traZODone (DESYREL) 50 MG tablet Take 0.5 tablets (25 mg total) by mouth at bedtime.   No facility-administered encounter medications on file as of 04/18/2022.     Patient Active Problem List   Diagnosis Date Noted   Screening for cervical cancer 10/18/2021   Encounter for screening mammogram for malignant neoplasm of breast 10/18/2021   ASCUS with positive high risk HPV 09/17/2012   HIV (human immunodeficiency virus infection) (HCC) 06/12/2012   Boil 06/12/2012   Asthma 06/12/2012    Health Maintenance Due  Topic Date Due   COVID-19 Vaccine (3 - Pfizer risk series) 09/03/2020     Review of Systems Review of Systems  Constitutional: Negative for fever, chills, diaphoresis, activity change, appetite change, fatigue and unexpected weight change.  HENT: Negative for congestion, sore throat, rhinorrhea, sneezing, trouble swallowing and sinus pressure.  Eyes: Negative for photophobia and visual disturbance.   Respiratory: Negative for cough, chest tightness, shortness of breath, wheezing and stridor.  Cardiovascular: Negative for chest pain, palpitations and leg swelling.  Gastrointestinal: Negative for nausea, vomiting, abdominal pain, diarrhea, constipation, blood in stool, abdominal distention and anal bleeding.  Genitourinary: Negative for dysuria, hematuria, flank pain and difficulty urinating.  Musculoskeletal: Negative for myalgias, back pain, joint swelling, arthralgias and gait problem.  Skin: Negative for color change, pallor, rash and wound.  Neurological: Negative for dizziness, tremors, weakness and light-headedness.  Hematological: Negative for adenopathy. Does not bruise/bleed easily.  Psychiatric/Behavioral: Negative for behavioral problems, confusion, sleep disturbance, dysphoric mood, decreased concentration and agitation.   Physical Exam  BP 130/84   Pulse 77   Temp 97.8 F (36.6 C) (Oral)   Ht 5\' 3"  (1.6 m)   Wt 178 lb (80.7 kg)   SpO2 100%   BMI 31.53 kg/m  Physical Exam  Constitutional:  oriented to person, place, and time. appears well-developed and well-nourished. No distress.  HENT: [place]/AT, PERRLA, no scleral icterus Mouth/Throat: Oropharynx is clear and moist. No oropharyngeal exudate.  Cardiovascular: Normal rate, regular rhythm and normal heart sounds. Exam reveals no gallop and no friction rub.  No murmur heard.  Pulmonary/Chest: Effort normal and breath sounds normal. No respiratory distress.  has no wheezes.  Neck = supple, no nuchal rigidity Lymphadenopathy: no cervical adenopathy. No axillary adenopathy Neurological: alert and oriented to person, place, and time.  Skin: Skin is warm and dry. No rash noted. No erythema.  Psychiatric: a normal mood and  affect.  behavior is normal.    Lab Results  Component Value Date   CD4TCELL 34 09/21/2021   Lab Results  Component Value Date   CD4TABS 855 09/21/2021   CD4TABS 969 07/13/2020   CD4TABS 934  12/17/2019   Lab Results  Component Value Date   HIV1RNAQUANT Not Detected 09/21/2021   Lab Results  Component Value Date   HEPBSAB POS (A) 06/12/2012   Lab Results  Component Value Date   LABRPR NON-REACTIVE 09/21/2021    CBC Lab Results  Component Value Date   WBC 6.2 09/21/2021   RBC 4.05 09/21/2021   HGB 11.6 (L) 09/21/2021   HCT 36.0 09/21/2021   PLT 348 09/21/2021   MCV 88.9 09/21/2021   MCH 28.6 09/21/2021   MCHC 32.2 09/21/2021   RDW 13.4 09/21/2021   LYMPHSABS 2,573 09/21/2021   MONOABS 2.1 (H) 03/08/2018   EOSABS 68 09/21/2021    BMET Lab Results  Component Value Date   NA 138 09/21/2021   K 4.2 09/21/2021   CL 107 09/21/2021   CO2 23 09/21/2021   GLUCOSE 70 09/21/2021   BUN 11 09/21/2021   CREATININE 0.87 09/21/2021   CALCIUM 9.1 09/21/2021   GFRNONAA 88 07/13/2020   GFRAA 102 07/13/2020      Assessment and Plan Hiv disease = needs labs to ensure still undetectable. Plan to refill biktarvy  Long term medication management/SE =will check cr to ensure still at baseline  Health maintenance = will give TDAP today.  Women's health = mammo ordered(with mammo scholarship) & also repeat pap exam appt  Situational stress = offered counseling

## 2022-04-19 LAB — T-HELPER CELL (CD4) - (RCID CLINIC ONLY)
CD4 % Helper T Cell: 37 % (ref 33–65)
CD4 T Cell Abs: 556 /uL (ref 400–1790)

## 2022-04-20 LAB — COMPLETE METABOLIC PANEL WITH GFR
AG Ratio: 1.4 (calc) (ref 1.0–2.5)
ALT: 18 U/L (ref 6–29)
AST: 21 U/L (ref 10–30)
Albumin: 4.5 g/dL (ref 3.6–5.1)
Alkaline phosphatase (APISO): 75 U/L (ref 31–125)
BUN: 9 mg/dL (ref 7–25)
CO2: 26 mmol/L (ref 20–32)
Calcium: 9.4 mg/dL (ref 8.6–10.2)
Chloride: 107 mmol/L (ref 98–110)
Creat: 0.93 mg/dL (ref 0.50–0.99)
Globulin: 3.2 g/dL (calc) (ref 1.9–3.7)
Glucose, Bld: 65 mg/dL (ref 65–99)
Potassium: 4.3 mmol/L (ref 3.5–5.3)
Sodium: 141 mmol/L (ref 135–146)
Total Bilirubin: 0.3 mg/dL (ref 0.2–1.2)
Total Protein: 7.7 g/dL (ref 6.1–8.1)
eGFR: 79 mL/min/{1.73_m2} (ref >=60)

## 2022-04-20 LAB — CBC WITH DIFFERENTIAL/PLATELET
Absolute Monocytes: 378 cells/uL (ref 200–950)
Basophils Absolute: 31 cells/uL (ref 0–200)
Basophils Relative: 0.8 %
Eosinophils Absolute: 51 cells/uL (ref 15–500)
Eosinophils Relative: 1.3 %
HCT: 36.2 % (ref 35.0–45.0)
Hemoglobin: 11.3 g/dL — ABNORMAL LOW (ref 11.7–15.5)
Lymphs Abs: 1697 cells/uL (ref 850–3900)
MCH: 26.6 pg — ABNORMAL LOW (ref 27.0–33.0)
MCHC: 31.2 g/dL — ABNORMAL LOW (ref 32.0–36.0)
MCV: 85.2 fL (ref 80.0–100.0)
MPV: 9.5 fL (ref 7.5–12.5)
Monocytes Relative: 9.7 %
Neutro Abs: 1743 cells/uL (ref 1500–7800)
Neutrophils Relative %: 44.7 %
Platelets: 400 10*3/uL (ref 140–400)
RBC: 4.25 10*6/uL (ref 3.80–5.10)
RDW: 15.9 % — ABNORMAL HIGH (ref 11.0–15.0)
Total Lymphocyte: 43.5 %
WBC: 3.9 10*3/uL (ref 3.8–10.8)

## 2022-04-20 LAB — LIPID PANEL
Cholesterol: 193 mg/dL (ref <200)
HDL: 71 mg/dL (ref >=50)
LDL Cholesterol (Calc): 94 mg/dL (calc)
Non-HDL Cholesterol (Calc): 122 mg/dL (calc) (ref <130)
Total CHOL/HDL Ratio: 2.7 (calc) (ref <5.0)
Triglycerides: 187 mg/dL — ABNORMAL HIGH (ref <150)

## 2022-04-20 LAB — HIV-1 RNA QUANT-NO REFLEX-BLD
HIV 1 RNA Quant: <20 Copies/mL — ABNORMAL HIGH
HIV-1 RNA Quant, Log: <1.3 Log cps/mL — ABNORMAL HIGH

## 2022-04-20 LAB — RPR: RPR Ser Ql: NONREACTIVE

## 2022-08-15 ENCOUNTER — Ambulatory Visit: Payer: Medicaid Other | Admitting: Internal Medicine

## 2022-10-27 ENCOUNTER — Inpatient Hospital Stay: Admission: RE | Admit: 2022-10-27 | Payer: Medicaid Other | Source: Ambulatory Visit

## 2023-03-22 NOTE — Progress Notes (Signed)
The 10-year ASCVD risk score (Arnett DK, et al., 2019) is: 0.4%   Values used to calculate the score:     Age: 42 years     Sex: Female     Is Non-Hispanic African American: Yes     Diabetic: No     Tobacco smoker: No     Systolic Blood Pressure: 130 mmHg     Is BP treated: No     HDL Cholesterol: 71 mg/dL     Total Cholesterol: 193 mg/dL  Sandie Ano, RN

## 2023-04-12 ENCOUNTER — Ambulatory Visit (INDEPENDENT_AMBULATORY_CARE_PROVIDER_SITE_OTHER): Payer: Medicaid Other | Admitting: Internal Medicine

## 2023-04-12 ENCOUNTER — Other Ambulatory Visit: Payer: Self-pay

## 2023-04-12 ENCOUNTER — Encounter: Payer: Self-pay | Admitting: Internal Medicine

## 2023-04-12 VITALS — BP 134/83 | HR 102 | Temp 97.8°F | Ht 63.0 in | Wt 186.0 lb

## 2023-04-12 DIAGNOSIS — Z79899 Other long term (current) drug therapy: Secondary | ICD-10-CM

## 2023-04-12 DIAGNOSIS — B20 Human immunodeficiency virus [HIV] disease: Secondary | ICD-10-CM

## 2023-04-12 DIAGNOSIS — Z0000 Encounter for general adult medical examination without abnormal findings: Secondary | ICD-10-CM

## 2023-04-12 LAB — CBC WITH DIFFERENTIAL/PLATELET
Absolute Monocytes: 484 cells/uL (ref 200–950)
Basophils Relative: 0.9 %
Eosinophils Absolute: 70 cells/uL (ref 15–500)
Lymphs Abs: 1742 cells/uL (ref 850–3900)
MCV: 83 fL (ref 80.0–100.0)
Monocytes Relative: 11 %
Neutrophils Relative %: 46.9 %
RDW: 14.7 % (ref 11.0–15.0)
WBC: 4.4 10*3/uL (ref 3.8–10.8)

## 2023-04-12 MED ORDER — ROSUVASTATIN CALCIUM 10 MG PO TABS: 10.0000 mg | ORAL_TABLET | Freq: Every day | ORAL | 11 refills | Status: DC

## 2023-04-12 NOTE — Progress Notes (Signed)
RFV: follow up for hiv disease Patient ID: Colleen Walls, female   DOB: [birth year], 42 y.o.   MRN: [id number]  HPI 42yo F with hiv disease, on biktarvy. Not missing doses. Well controlled. Last seen 12 months ago. She reports to be in better health than when we last saw her. She has been doing therapy with ms. Katrinka Blazing -- helps with grieving brother's lost. No other health issues  Outpatient Encounter Medications as of 04/12/2023  Medication Sig   bictegravir-emtricitabine-tenofovir AF (BIKTARVY) 50-200-25 MG TABS tablet Take 1 tablet by mouth daily.   PROVENTIL HFA 108 (90 Base) MCG/ACT inhaler INHALE 2 PUFFS INTO THE LUNGS EVERY 6 HOURS AS NEEDED FOR SHORTNESS OF BREATH OR WHEEZING (Patient not taking: Reported on 04/12/2023)   traZODone (DESYREL) 50 MG tablet Take 0.5 tablets (25 mg total) by mouth at bedtime. (Patient not taking: Reported on 04/18/2022)   No facility-administered encounter medications on file as of 04/12/2023.     Patient Active Problem List   Diagnosis Date Noted   Screening for cervical cancer 10/18/2021   Encounter for screening mammogram for malignant neoplasm of breast 10/18/2021   ASCUS with positive high risk HPV 09/17/2012   HIV (human immunodeficiency virus infection) (HCC) 06/12/2012   Boil 06/12/2012   Asthma 06/12/2012     Health Maintenance Due  Topic Date Due   COVID-19 Vaccine (3 - Pfizer risk series) 09/03/2020     Review of Systems Review of Systems  Constitutional: Negative for fever, chills, diaphoresis, activity change, appetite change, fatigue and unexpected weight change.  HENT: Negative for congestion, sore throat, rhinorrhea, sneezing, trouble swallowing and sinus pressure.  Eyes: Negative for photophobia and visual disturbance.  Respiratory: Negative for cough, chest tightness, shortness of breath, wheezing and stridor.  Cardiovascular: Negative for chest pain, palpitations and leg swelling.  Gastrointestinal: Negative for nausea,  vomiting, abdominal pain, diarrhea, constipation, blood in stool, abdominal distention and anal bleeding.  Genitourinary: Negative for dysuria, hematuria, flank pain and difficulty urinating.  Musculoskeletal: Negative for myalgias, back pain, joint swelling, arthralgias and gait problem.  Skin: Negative for color change, pallor, rash and wound.  Neurological: Negative for dizziness, tremors, weakness and light-headedness.  Hematological: Negative for adenopathy. Does not bruise/bleed easily.  Psychiatric/Behavioral: Negative for behavioral problems, confusion, sleep disturbance, dysphoric mood, decreased concentration and agitation.   Physical Exam   BP 134/83   Pulse (!) 102   Temp 97.8 F (36.6 C) (Temporal)   Ht 5\' 3"  (1.6 m)   Wt 186 lb (84.4 kg)   SpO2 97%   BMI 32.95 kg/m   Physical Exam  Constitutional:  oriented to person, place, and time. appears well-developed and well-nourished. No distress.  HENT: [place]/AT, PERRLA, no scleral icterus Mouth/Throat: Oropharynx is clear and moist. No oropharyngeal exudate.  Cardiovascular: Normal rate, regular rhythm and normal heart sounds. Exam reveals no gallop and no friction rub.  No murmur heard.  Pulmonary/Chest: Effort normal and breath sounds normal. No respiratory distress.  has no wheezes.  Neck = supple, no nuchal rigidity Abdominal: Soft. Bowel sounds are normal.  exhibits no distension. There is no tenderness.  Lymphadenopathy: no cervical adenopathy. No axillary adenopathy Neurological: alert and oriented to person, place, and time.  Skin: Skin is warm and dry. No rash noted. No erythema.  Psychiatric: a normal mood and affect.  behavior is normal.   Lab Results  Component Value Date   CD4TCELL 37 04/18/2022   Lab Results  Component Value Date   CD4TABS  556 04/18/2022   CD4TABS 855 09/21/2021   CD4TABS 969 07/13/2020   Lab Results  Component Value Date   HIV1RNAQUANT <20 (H) 04/18/2022   Lab Results  Component  Value Date   HEPBSAB POS (A) 06/12/2012   Lab Results  Component Value Date   LABRPR NON-REACTIVE 04/18/2022    CBC Lab Results  Component Value Date   WBC 3.9 04/18/2022   RBC 4.25 04/18/2022   HGB 11.3 (L) 04/18/2022   HCT 36.2 04/18/2022   PLT 400 04/18/2022   MCV 85.2 04/18/2022   MCH 26.6 (L) 04/18/2022   MCHC 31.2 (L) 04/18/2022   RDW 15.9 (H) 04/18/2022   LYMPHSABS 1,697 04/18/2022   MONOABS 2.1 (H) 03/08/2018   EOSABS 51 04/18/2022    BMET Lab Results  Component Value Date   NA 141 04/18/2022   K 4.3 04/18/2022   CL 107 04/18/2022   CO2 26 04/18/2022   GLUCOSE 65 04/18/2022   BUN 9 04/18/2022   CREATININE 0.93 04/18/2022   CALCIUM 9.4 04/18/2022   GFRNONAA 88 07/13/2020   GFRAA 102 07/13/2020      Assessment and Plan  Women's health = Needs mammo and pap  Hiv disease = plan to continue with biktravy; will get annual labs  Long term medication management = will check cr and plan to compare to 2023 labs  Health maintenance =  Start on rosuvastatin 10mg  dailiy for cardioprotective

## 2023-04-13 LAB — T-HELPER CELLS (CD4) COUNT (NOT AT ARMC)
Absolute CD4: 648 cells/uL (ref 490–1740)
CD4 T Helper %: 35 % (ref 30–61)

## 2023-04-13 LAB — LIPID PANEL
Cholesterol: 176 mg/dL (ref <200)
LDL Cholesterol (Calc): 82 mg/dL (calc)
Triglycerides: 124 mg/dL (ref <150)

## 2023-04-13 LAB — COMPLETE METABOLIC PANEL WITH GFR
AG Ratio: 1.3 (calc) (ref 1.0–2.5)
AST: 17 U/L (ref 10–30)
Albumin: 3.9 g/dL (ref 3.6–5.1)
BUN: 10 mg/dL (ref 7–25)
Chloride: 107 mmol/L (ref 98–110)
Glucose, Bld: 88 mg/dL (ref 65–99)

## 2023-04-14 LAB — HIV-1 RNA QUANT-NO REFLEX-BLD
HIV 1 RNA Quant: NOT DETECTED Copies/mL
HIV-1 RNA Quant, Log: NOT DETECTED Log cps/mL

## 2023-04-14 LAB — COMPLETE METABOLIC PANEL WITH GFR
ALT: 14 U/L (ref 6–29)
Alkaline phosphatase (APISO): 64 U/L (ref 31–125)
CO2: 23 mmol/L (ref 20–32)
Calcium: 9.1 mg/dL (ref 8.6–10.2)
Creat: 0.89 mg/dL (ref 0.50–0.99)
Globulin: 3 g/dL (calc) (ref 1.9–3.7)
Potassium: 4.1 mmol/L (ref 3.5–5.3)
Sodium: 138 mmol/L (ref 135–146)
Total Bilirubin: 0.4 mg/dL (ref 0.2–1.2)
Total Protein: 6.9 g/dL (ref 6.1–8.1)
eGFR: 82 mL/min/{1.73_m2} (ref >=60)

## 2023-04-14 LAB — CBC WITH DIFFERENTIAL/PLATELET
Basophils Absolute: 40 cells/uL (ref 0–200)
Eosinophils Relative: 1.6 %
HCT: 31.2 % — ABNORMAL LOW (ref 35.0–45.0)
Hemoglobin: 9.9 g/dL — ABNORMAL LOW (ref 11.7–15.5)
MCH: 26.3 pg — ABNORMAL LOW (ref 27.0–33.0)
MCHC: 31.7 g/dL — ABNORMAL LOW (ref 32.0–36.0)
MPV: 9.9 fL (ref 7.5–12.5)
Neutro Abs: 2064 cells/uL (ref 1500–7800)
Platelets: 427 10*3/uL — ABNORMAL HIGH (ref 140–400)
RBC: 3.76 10*6/uL — ABNORMAL LOW (ref 3.80–5.10)
Total Lymphocyte: 39.6 %

## 2023-04-14 LAB — LIPID PANEL
HDL: 71 mg/dL (ref >=50)
Non-HDL Cholesterol (Calc): 105 mg/dL (calc) (ref <130)
Total CHOL/HDL Ratio: 2.5 (calc) (ref <5.0)

## 2023-04-14 LAB — RPR: RPR Ser Ql: NONREACTIVE

## 2023-04-14 LAB — T-HELPER CELLS (CD4) COUNT (NOT AT ARMC): Total lymphocyte count: 1834 cells/uL (ref 850–3900)

## 2023-04-18 ENCOUNTER — Other Ambulatory Visit: Payer: Self-pay | Admitting: Internal Medicine

## 2023-04-18 DIAGNOSIS — Z21 Asymptomatic human immunodeficiency virus [HIV] infection status: Secondary | ICD-10-CM

## 2023-05-19 ENCOUNTER — Other Ambulatory Visit: Payer: Self-pay | Admitting: Internal Medicine

## 2023-05-19 DIAGNOSIS — Z21 Asymptomatic human immunodeficiency virus [HIV] infection status: Secondary | ICD-10-CM

## 2023-08-14 ENCOUNTER — Ambulatory Visit: Payer: Medicaid Other | Admitting: Internal Medicine

## 2023-10-24 ENCOUNTER — Other Ambulatory Visit: Payer: Self-pay | Admitting: Internal Medicine

## 2023-10-24 DIAGNOSIS — Z21 Asymptomatic human immunodeficiency virus [HIV] infection status: Secondary | ICD-10-CM

## 2023-11-24 ENCOUNTER — Other Ambulatory Visit: Payer: Self-pay | Admitting: Internal Medicine

## 2023-11-24 DIAGNOSIS — Z21 Asymptomatic human immunodeficiency virus [HIV] infection status: Secondary | ICD-10-CM

## 2023-12-29 ENCOUNTER — Other Ambulatory Visit: Payer: Self-pay | Admitting: Internal Medicine

## 2023-12-29 DIAGNOSIS — Z21 Asymptomatic human immunodeficiency virus [HIV] infection status: Secondary | ICD-10-CM

## 2024-02-08 ENCOUNTER — Other Ambulatory Visit: Payer: Self-pay | Admitting: Internal Medicine

## 2024-02-08 DIAGNOSIS — Z21 Asymptomatic human immunodeficiency virus [HIV] infection status: Secondary | ICD-10-CM

## 2024-02-21 NOTE — Progress Notes (Signed)
 The 10-year ASCVD risk score (Arnett DK, et al., 2019) is: 0.6%   Values used to calculate the score:     Age: 43 years     Sex: Female     Is Non-Hispanic African American: Yes     Diabetic: No     Tobacco smoker: No     Systolic Blood Pressure: 134 mmHg     Is BP treated: No     HDL Cholesterol: 71 mg/dL     Total Cholesterol: 176 mg/dL  Arlon Bergamo, BSN, RN

## 2024-03-17 ENCOUNTER — Other Ambulatory Visit: Payer: Self-pay | Admitting: Internal Medicine

## 2024-03-17 DIAGNOSIS — Z21 Asymptomatic human immunodeficiency virus [HIV] infection status: Secondary | ICD-10-CM

## 2024-04-11 ENCOUNTER — Other Ambulatory Visit: Payer: Self-pay | Admitting: Internal Medicine

## 2024-04-11 DIAGNOSIS — Z21 Asymptomatic human immunodeficiency virus [HIV] infection status: Secondary | ICD-10-CM

## 2024-05-08 ENCOUNTER — Other Ambulatory Visit: Payer: Self-pay | Admitting: Internal Medicine

## 2024-05-08 DIAGNOSIS — Z21 Asymptomatic human immunodeficiency virus [HIV] infection status: Secondary | ICD-10-CM

## 2024-05-23 ENCOUNTER — Other Ambulatory Visit

## 2024-06-05 ENCOUNTER — Ambulatory Visit: Payer: Self-pay | Admitting: Internal Medicine

## 2024-06-12 ENCOUNTER — Other Ambulatory Visit: Payer: Self-pay | Admitting: Internal Medicine

## 2024-06-12 DIAGNOSIS — Z21 Asymptomatic human immunodeficiency virus [HIV] infection status: Secondary | ICD-10-CM

## 2024-06-27 ENCOUNTER — Ambulatory Visit: Payer: Self-pay | Admitting: Internal Medicine

## 2024-06-27 ENCOUNTER — Encounter: Payer: Self-pay | Admitting: Internal Medicine

## 2024-06-27 ENCOUNTER — Ambulatory Visit

## 2024-06-27 ENCOUNTER — Other Ambulatory Visit: Payer: Self-pay

## 2024-06-27 VITALS — BP 134/82 | HR 105 | Temp 98.0°F | Ht 63.0 in | Wt 187.0 lb

## 2024-06-27 DIAGNOSIS — Z79899 Other long term (current) drug therapy: Secondary | ICD-10-CM

## 2024-06-27 DIAGNOSIS — Z21 Asymptomatic human immunodeficiency virus [HIV] infection status: Secondary | ICD-10-CM

## 2024-06-27 DIAGNOSIS — Z23 Encounter for immunization: Secondary | ICD-10-CM

## 2024-06-27 DIAGNOSIS — B20 Human immunodeficiency virus [HIV] disease: Secondary | ICD-10-CM

## 2024-06-27 DIAGNOSIS — M545 Low back pain, unspecified: Secondary | ICD-10-CM

## 2024-06-27 DIAGNOSIS — Z113 Encounter for screening for infections with a predominantly sexual mode of transmission: Secondary | ICD-10-CM

## 2024-06-27 MED ORDER — METHOCARBAMOL 500 MG PO TABS: 500.0000 mg | ORAL_TABLET | Freq: Three times a day (TID) | ORAL | 0 refills | Status: AC | PRN

## 2024-06-27 MED ORDER — BIKTARVY 50-200-25 MG PO TABS: 1.0000 | ORAL_TABLET | Freq: Every day | ORAL | 11 refills | Status: AC

## 2024-06-27 MED ORDER — ROSUVASTATIN CALCIUM 10 MG PO TABS: 10.0000 mg | ORAL_TABLET | Freq: Every day | ORAL | 11 refills | Status: AC
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
## 2024-06-28 LAB — T-HELPER CELL (CD4) - (RCID CLINIC ONLY)
CD4 % Helper T Cell: 35 % (ref 33–65)
CD4 T Cell Abs: 711 /uL (ref 400–1790)

## 2024-06-29 LAB — CBC WITH DIFFERENTIAL/PLATELET
Absolute Lymphocytes: 2096 {cells}/uL (ref 850–3900)
Absolute Monocytes: 721 {cells}/uL (ref 200–950)
Basophils Absolute: 61 {cells}/uL (ref 0–200)
Basophils Relative: 1.1 %
Eosinophils Absolute: 83 {cells}/uL (ref 15–500)
Eosinophils Relative: 1.5 %
HCT: 30.6 % — ABNORMAL LOW (ref 35.0–45.0)
Hemoglobin: 9.3 g/dL — ABNORMAL LOW (ref 11.7–15.5)
MCH: 24.3 pg — ABNORMAL LOW (ref 27.0–33.0)
MCHC: 30.4 g/dL — ABNORMAL LOW (ref 32.0–36.0)
MCV: 79.9 fL — ABNORMAL LOW (ref 80.0–100.0)
MPV: 9.7 fL (ref 7.5–12.5)
Monocytes Relative: 13.1 %
Neutro Abs: 2541 {cells}/uL (ref 1500–7800)
Neutrophils Relative %: 46.2 %
Platelets: 455 Thousand/uL — ABNORMAL HIGH (ref 140–400)
RBC: 3.83 Million/uL (ref 3.80–5.10)
RDW: 15.3 % — ABNORMAL HIGH (ref 11.0–15.0)
Total Lymphocyte: 38.1 %
WBC: 5.5 Thousand/uL (ref 3.8–10.8)

## 2024-06-29 LAB — LIPID PANEL
Cholesterol: 196 mg/dL (ref <200)
HDL: 73 mg/dL (ref >=50)
LDL Cholesterol (Calc): 98 mg/dL
Non-HDL Cholesterol (Calc): 123 mg/dL (ref <130)
Total CHOL/HDL Ratio: 2.7 (calc) (ref <5.0)
Triglycerides: 148 mg/dL (ref <150)

## 2024-06-29 LAB — COMPLETE METABOLIC PANEL WITHOUT GFR
AG Ratio: 1.3 (calc) (ref 1.0–2.5)
ALT: 18 U/L (ref 6–29)
AST: 20 U/L (ref 10–30)
Albumin: 4.2 g/dL (ref 3.6–5.1)
Alkaline phosphatase (APISO): 67 U/L (ref 31–125)
BUN: 13 mg/dL (ref 7–25)
CO2: 24 mmol/L (ref 20–32)
Calcium: 9.4 mg/dL (ref 8.6–10.2)
Chloride: 109 mmol/L (ref 98–110)
Creat: 0.99 mg/dL (ref 0.50–0.99)
Globulin: 3.2 g/dL (ref 1.9–3.7)
Glucose, Bld: 88 mg/dL (ref 65–99)
Potassium: 4.7 mmol/L (ref 3.5–5.3)
Sodium: 140 mmol/L (ref 135–146)
Total Bilirubin: 0.2 mg/dL (ref 0.2–1.2)
Total Protein: 7.4 g/dL (ref 6.1–8.1)

## 2024-06-29 LAB — HIV-1 RNA QUANT-NO REFLEX-BLD
HIV 1 RNA Quant: NOT DETECTED {copies}/mL
HIV-1 RNA Quant, Log: NOT DETECTED {Log_copies}/mL

## 2024-06-29 LAB — RPR: RPR Ser Ql: NONREACTIVE

## 2024-07-24 ENCOUNTER — Ambulatory Visit: Payer: Self-pay | Admitting: Infectious Diseases

## 2024-12-19 ENCOUNTER — Ambulatory Visit: Admitting: Internal Medicine
